# Patient Record
Sex: Male | Born: 2000 | Race: White | Hispanic: No | Marital: Single | Smoking: Never smoker
Health system: Southern US, Community
[De-identification: ages and names within clinical notes are randomized; demographics above are authoritative.]

## PROBLEM LIST (undated history)

## (undated) DIAGNOSIS — Z8701 Personal history of pneumonia (recurrent): Secondary | ICD-10-CM

## (undated) DIAGNOSIS — L309 Dermatitis, unspecified: Secondary | ICD-10-CM

## (undated) DIAGNOSIS — E669 Obesity, unspecified: Secondary | ICD-10-CM

## (undated) HISTORY — DX: Personal history of pneumonia (recurrent): Z87.01

## (undated) HISTORY — DX: Dermatitis, unspecified: L30.9

## (undated) HISTORY — DX: Obesity, unspecified: E66.9

---
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
## 2018-02-04 ENCOUNTER — Other Ambulatory Visit (INDEPENDENT_AMBULATORY_CARE_PROVIDER_SITE_OTHER): Payer: 59

## 2018-02-04 DIAGNOSIS — E038 Other specified hypothyroidism: Secondary | ICD-10-CM

## 2018-02-04 DIAGNOSIS — E039 Hypothyroidism, unspecified: Secondary | ICD-10-CM | POA: Diagnosis not present

## 2018-02-04 DIAGNOSIS — F418 Other specified anxiety disorders: Secondary | ICD-10-CM

## 2018-02-05 LAB — T4, FREE: FREE T4: 1.22 ng/dL (ref 0.93–1.60)

## 2018-02-05 LAB — TSH: TSH: 2.65 u[IU]/mL (ref 0.450–4.500)

## 2018-02-05 LAB — T3, FREE: T3, Free: 4 pg/mL (ref 2.3–5.0)

## 2018-02-25 ENCOUNTER — Encounter: Payer: Self-pay | Admitting: Family Medicine

## 2018-02-25 ENCOUNTER — Ambulatory Visit (INDEPENDENT_AMBULATORY_CARE_PROVIDER_SITE_OTHER): Payer: 59 | Admitting: Family Medicine

## 2018-02-25 VITALS — BP 129/79 | HR 60 | Ht 72.0 in | Wt 361.2 lb

## 2018-02-25 DIAGNOSIS — F418 Other specified anxiety disorders: Secondary | ICD-10-CM

## 2018-02-25 DIAGNOSIS — R6889 Other general symptoms and signs: Secondary | ICD-10-CM | POA: Insufficient documentation

## 2018-02-25 DIAGNOSIS — E038 Other specified hypothyroidism: Secondary | ICD-10-CM

## 2018-02-25 DIAGNOSIS — G4700 Insomnia, unspecified: Secondary | ICD-10-CM

## 2018-02-25 DIAGNOSIS — E039 Hypothyroidism, unspecified: Secondary | ICD-10-CM

## 2018-02-25 DIAGNOSIS — Z6841 Body Mass Index (BMI) 40.0 and over, adult: Secondary | ICD-10-CM

## 2018-02-25 NOTE — Progress Notes (Signed)
Impression and Recommendations:    1. Morbid obesity with BMI of 50.0-59.9, adult (HCC)   2. Subclinical hypothyroidism   3. Poor motivation vs poor focus   4. Situational anxiety   5. Insomnia, unspecified type     Weight loss -Pt has not been meeting exercise goals -Pt has lost 7lbs since last appointment due to dietary changes -Discussed the importance of small, attainable goals for maintaining lifestyle changes -Discussed low calorie options for beverages like green stevia packets or monk fruit to sweeten drinks  -Discussed how important it is to put his health first and focus on making good changes such as healthy diet -Encouraged pt to reset his goal of increasing his exercise levels including walking at least 5 min each day -Follow up in 2 months to evaluate weight loss  Focus/motivation -Discussed the roll of PCP in ADHD care -Encouraged pt to make an appointment with [facility] for testing  -Discussed common symptoms and symptoms that are not related to ADHD such as auditory hallucinations -Strongly encouraged pt contact us if auditory hallucinations increase -Discussed the difference between psychology and psychiatry -Instructed to created follow up appointment after the appointment with [facility]  Insomnia -Encouraged pt to consider non-medicinal ways to help with sleep -Discussed sleep meditation to help calm down before bed -Recommended pt to look up youtube videos to help with winding down mind before bed  Hypothyroidism -Encouraged pt to take thyroid medication as directed -Encouraged pt to set a daily alarm to remind himself to take prescriptions -Discussed current thyroid labs and indications  Gross side effects, risk and benefits, and alternatives of medications and treatment plan in general discussed with patient.  Patient is aware that all medications have potential side effects and we are unable to predict  every side effect or drug-drug interaction that may occur.   Patient will call with any questions prior to using medication if they have concerns.  Expresses verbal understanding and consents to current therapy and treatment regimen.  No barriers to understanding were identified.  Red flag symptoms and signs discussed in detail.  Patient expressed understanding regarding what to do in case of emergency\urgent symptoms  Please see AVS handed out to patient at the end of our visit for further patient instructions/ counseling done pertaining to today's office visit.   Return for 2-3 mo after seen by [facility].    Note:  This note was prepared with assistance of Dragon voice recognition software. Occasional wrong-word or sound-a-like substitutions may have occurred due to the inherent limitations of voice recognition software.  This document serves as a record of services personally performed by Thomasene Lot, MD. It was created on her behalf by Alphonse Guild, a trained medical scribe. The creation of this record is based on the scribe's personal observations and the provider's statements to them.   I have reviewed the above medical documentation for accuracy and completeness and I concur.  Thomasene Lot 02/26/18 1:44 PM   --------------------------------------------------------------------------------------------------------------------------------------------------------------------------------------------------------------------------------------------    Subjective:     HPI: Gregory Reed is a 17 y.o. male who presents to [facility] today for issues as discussed below.  Lifestyle -Pt is in a long distance relationship with a man -Pt has some concerns about ADHD and is interested in testing -Feels unsure about whether his issues are due to anxiety or ADHD  Weight Loss -Pt has lost 7 lbs since last appointment -States he  hasn't been  checking his weight at home because he forgets -Pt believes he has been eating better and that's helped him lose weight -Pt has reduced some of his usual carb intake such as bread -Has started increasing his amount of vegetables such as salad -He has still been drinking tea, some sodas and has not increased his water intake  Thyroid -Pt states he forgets to take the thyroid medication "once or twice a week" -Pt TSH levels are at 2.65, down from 5.1 on December 10, 2017  Focus/motivation -Pt is concerned that he may have ADHD -States he has noticed an issue with focus, paying attention, and following conversations -Believes he has been "hearing things" which he believes are related, but states he may have just been paranoid because he was in a new environment that day -Pt states he's never had issues in the past with focus or had any documentation of issues  Insomnia -PT states he has hasn't been having any more issues with insomnia "than usual"      Wt Readings from Last 3 Encounters:  02/25/18 (!) 361 lb 3.2 oz (163.8 kg) (>99 %, Z= 3.58)*  12/26/17 (!) 368 lb (166.9 kg) (>99 %, Z= 3.62)*  10/15/17 (!) 372 lb (168.7 kg) (>99 %, Z= 3.63)*   * Growth percentiles are based on CDC (Boys, 2-20 Years) data.   BP Readings from Last 3 Encounters:  02/25/18 129/79  12/26/17 124/78  10/15/17 121/82   Pulse Readings from Last 3 Encounters:  02/25/18 60  12/26/17 85  10/15/17 73   BMI Readings from Last 3 Encounters:  02/25/18 48.99 kg/m (>99 %, Z= 3.12)*  12/26/17 49.91 kg/m (>99 %, Z= 3.16)*  10/15/17 50.45 kg/m (>99 %, Z= 3.19)*   * Growth percentiles are based on CDC (Boys, 2-20 Years) data.     Patient Care Team    Relationship Specialty Notifications Start End  Thomasene Lot, DO PCP - General Family Medicine  10/15/17   Eustaquio Boyden, MD Consulting Physician Family Medicine  10/15/17      Patient Active Problem List   Diagnosis Date Noted  . Subclinical  hypothyroidism 12/26/2017    Priority: High  . Elevated cholesterol with elevated triglycerides 12/26/2017    Priority: High  . Morbid obesity with BMI of 50.0-59.9, adult [facility])     Priority: High  . Low HDL (under 40) 12/26/2017    Priority: Medium  . Anxious mood 10/15/2017    Priority: Medium  . Insomnia 10/15/2017    Priority: Medium  . Situational anxiety 08/24/2017    Priority: Medium  . Headache 01/29/2016    Priority: Medium  . Poor motivation vs poor focus 02/25/2018  . Left ankle injury 02/16/2017  . Seasonal allergic rhinitis 05/11/2014  . Well adolescent visit 05/11/2014    Past Medical history, Surgical history, Family history, Social history, Allergies and Medications have been entered into the medical record, reviewed and changed as needed.    Current Meds  Medication Sig  . fluticasone (FLONASE) 50 MCG/ACT nasal spray Place 2 sprays into both nostrils daily.  . hydrOXYzine (ATARAX/VISTARIL) 25 MG tablet Take 0.5-1 tablets (12.5-25 mg total) by mouth 3 (three) times daily as needed for anxiety.  Marland Kitchen levothyroxine (SYNTHROID, LEVOTHROID) 25 MCG tablet Take 1 tablet (25 mcg total) by mouth daily.    Allergies:  Allergies  Allergen Reactions  . Codeine Nausea And Vomiting     Review of Systems:  A fourteen system review of systems was performed  and found to be positive as per HPI.   Objective:   Blood pressure 129/79, pulse 60, height 6' (1.829 m), weight (!) 361 lb 3.2 oz (163.8 kg), SpO2 98 %. Body mass index is 48.99 kg/m. General:  Well Developed, well nourished, appropriate for stated age.  Neuro:  Alert and oriented,  extra-ocular muscles intact  HEENT:  Normocephalic, atraumatic, neck supple, no carotid bruits appreciated  Skin:  no gross rash, warm, pink. Cardiac:  RRR, S1 S2 Respiratory:  ECTA B/L and A/P, Not using accessory muscles, speaking in full sentences- unlabored. Vascular:  Ext warm, no cyanosis apprec.; cap RF less 2 sec. Psych:  No  HI/SI, judgement and insight good, Euthymic mood. Full Affect.

## 2018-02-25 NOTE — Patient Instructions (Addendum)
-  Goal of walking 5 min every single day. -Goal of drinking no beverages with calories. Pt can drink diet drinks such as coke zero, diet green tea, flavored sparkling water, or sparkling ice with caffeine for energy -Use green stevia packets or monk fruit to sweeten drinks to avoid calories  [facility] has physicians that are able to diagnose ADHD

## 2018-03-01 ENCOUNTER — Encounter: Payer: 59 | Admitting: Family Medicine

## 2018-08-19 ENCOUNTER — Other Ambulatory Visit: Payer: Self-pay | Admitting: Family Medicine

## 2018-09-10 ENCOUNTER — Other Ambulatory Visit: Payer: Self-pay

## 2018-09-10 ENCOUNTER — Ambulatory Visit (INDEPENDENT_AMBULATORY_CARE_PROVIDER_SITE_OTHER): Payer: 59 | Admitting: Family Medicine

## 2018-09-10 ENCOUNTER — Encounter: Payer: Self-pay | Admitting: Family Medicine

## 2018-09-10 DIAGNOSIS — E782 Mixed hyperlipidemia: Secondary | ICD-10-CM

## 2018-09-10 DIAGNOSIS — E786 Lipoprotein deficiency: Secondary | ICD-10-CM | POA: Diagnosis not present

## 2018-09-10 DIAGNOSIS — F418 Other specified anxiety disorders: Secondary | ICD-10-CM | POA: Diagnosis not present

## 2018-09-10 DIAGNOSIS — R6889 Other general symptoms and signs: Secondary | ICD-10-CM

## 2018-09-10 DIAGNOSIS — Z6841 Body Mass Index (BMI) 40.0 and over, adult: Secondary | ICD-10-CM

## 2018-09-10 NOTE — Progress Notes (Signed)
Virtual Visit via Telephone Note for [employer], D.O- Primary Care Physician at [facility]   I connected with current patient today by telephone and verified that I am speaking with the correct person using two identifiers.   I discussed the limitations, risks, security and privacy concerns of performing an evaluation and management service by telephone and the limited availability of in person appointments during this current national crisis of the Covid-19 pandemic.  My staff members also discussed with the patient that there may be a patient responsible charge related to this service.  The patient expressed understanding and agreed to proceed.     History of Present Illness:  - did not go to [facility] for assessment of ADD  - has not weighed himself  - has not achieved any of his health goals he set forth last OV    GOALS for last visit- 9/19:  -Goal of walking 5 min every single day. -Goal of drinking no beverages with calories. Pt can drink diet drinks such as coke zero, diet green tea, flavored sparkling water, or sparkling ice with caffeine for energy -Use green stevia packets or monk fruit to sweeten drinks to avoid Calories for your   - [facility] has specialty physicians that are able to fully assess and diagnose ADHD through various tools that I do not have access to. Please call them for an office visit for eval    Wt Readings from Last 3 Encounters:  02/25/18 (!) 361 lb 3.2 oz (163.8 kg) (>99 %, Z= 3.58)*  12/26/17 (!) 368 lb (166.9 kg) (>99 %, Z= 3.62)*  10/15/17 (!) 372 lb (168.7 kg) (>99 %, Z= 3.63)*   * Growth percentiles are based on CDC (Boys, 2-20 Years) data.   BP Readings from Last 3 Encounters:  02/25/18 129/79  12/26/17 124/78  10/15/17 121/82   Pulse Readings from Last 3 Encounters:  02/25/18 60  12/26/17 85  10/15/17 73   BMI Readings from Last 3 Encounters:  02/25/18 48.99 kg/m (>99 %, Z= 3.12)*  12/26/17  49.91 kg/m (>99 %, Z= 3.16)*  10/15/17 50.45 kg/m (>99 %, Z= 3.19)*   * Growth percentiles are based on CDC (Boys, 2-20 Years) data.     Patient Care Team    Relationship Specialty Notifications Start End  Thomasene Lot, DO PCP - General Family Medicine  10/15/17   Eustaquio Boyden, MD Consulting Physician Family Medicine  10/15/17      Patient Active Problem List   Diagnosis Date Noted  . Subclinical hypothyroidism 12/26/2017    Priority: High  . Elevated cholesterol with elevated triglycerides 12/26/2017    Priority: High  . Morbid obesity with BMI of 50.0-59.9, adult [facility])     Priority: High  . Low HDL (under 40) 12/26/2017    Priority: Medium  . Anxious mood 10/15/2017    Priority: Medium  . Insomnia 10/15/2017    Priority: Medium  . Situational anxiety 08/24/2017    Priority: Medium  . Headache 01/29/2016    Priority: Medium  . Poor motivation vs poor focus 02/25/2018  . Left ankle injury 02/16/2017  . Seasonal allergic rhinitis 05/11/2014  . Well adolescent visit 05/11/2014     No outpatient medications have been marked as taking for the 09/10/18 encounter (Office Visit) with Thomasene Lot, DO.     Allergies:  Allergies  Allergen Reactions  . Codeine Nausea And Vomiting     ROS:  See above HPI for pertinent positives  and negatives   Objective:   There were no vitals taken for this visit. There is no height or weight on file to calculate BMI.   General: sounds in no acute distress.  Skin: Pt confirms warm and dry  extremities and pink fingertips Respiratory: speaking in full sentences, no conversational dyspnea Psych: A and O *3, appears insight good, mood- full      Impression and Recommendations:     - pt asked for me to email him his goals from last OV and also email him at [email or web address]  1. Poor motivation vs poor focus- ? is add or not- never been evaluated by specialists - Again told pt to f/up for full testing and eval thru  specialist - told pt he is not hitting goals and even the small ones we set are important for him to acheive  2. Morbid obesity with BMI of 50.0-59.9, adult (HCC)  - Explained to patient what BMI refers to, and what it means medically.    Told patient to think about it as a "medical risk stratification measurement" and how increasing BMI is associated with increasing risk/ or worsening state of various diseases such as hypertension, hyperlipidemia, diabetes, premature OA, depression/ mood d/o;  Dec energy levels etc.etc.  - R/w pt again American Heart Association guidelines for healthy diet, basically Mediterranean diet, and exercise guidelines of a min of 30 minutes 5 days per week or more in detail. - Health counseling performed.  All questions answered.   3. Situational anxiety Well controlled at current b/c pt is home bound due to covid   4. Extensive Health counseling and education - Novel Covid -19 counseling done; all questions were answered.  Reminded pt of extreme importance of social distancing; minimizing contacts with others etc etc  -  Sent pt separate message via MyChart ( per his request ) stating: "Apolinar Junes,   It was great to speak with you today.   I do not know what your weight is now but as we discussed, please keep an eye on it at home.  Remember, you were doing great and had lost 9 pounds between May 2019 and September 2019 when I saw you.  Let's keep it up!! :)   GOALS:   -Goal of walking at least 5 min every single day.  - Goal would be to keep a more regular sleep-wake cycle and be consistent with it.  Falling asleep by 11 PM and waking up at 7 for instance is a healthy habit.   This is 8 hours of sleep and ideal.    If you sleep too much or too little, this can cause detrimental health effects.  It can actually cause you to gain weight, cause mood changes etc  -Goal of drinking no beverages with calories. Pt can drink diet drinks such as coke zero, diet green tea,  flavored sparkling water, or sparkling ice with caffeine for energy  -Use green stevia packets or monk fruit to sweeten drinks to avoid Calories for your   - [facility] has specialty physicians that are able to fully assess and diagnose ADHD through various tools that I do not have access to. Please call them for an office visit for eval  - I look forward to chatting with you about these goals and your progress in 3 months.  Be well,   Dr Sharee Holster"   I discussed the assessment and treatment plan with the patient. The patient was provided an opportunity to ask  questions and all were answered. The patient agreed with the plan and demonstrated an understanding of the instructions.   The patient was advised to call back or seek an in-person evaluation if the symptoms worsen or if the condition fails to improve as anticipated.   No orders of the defined types were placed in this encounter.   No orders of the defined types were placed in this encounter.   There are no discontinued medications.   Gross side effects, risk and benefits, and alternatives of medications and treatment plan in general discussed with patient.  Patient is aware that all medications have potential side effects and we are unable to predict every side effect or drug-drug interaction that may occur.   Patient was strongly encouraged to call with any questions or concerns they may have concerns.    Expresses verbal understanding and consents to current therapy and treatment regimen.  No barriers to understanding were identified.  Red flag symptoms and signs discussed in detail.  Patient expressed understanding regarding what to do in case of emergency\urgent symptoms  Please see AVS handed out to patient at the end of our visit for further patient instructions/ counseling done pertaining to today's office visit.  I provided 18+ minutes of non-face-to-face time during this encounter.     Thomasene Lot, DO

## 2018-09-18 ENCOUNTER — Telehealth: Payer: Self-pay | Admitting: Family Medicine

## 2018-09-18 NOTE — Telephone Encounter (Signed)
FYI to medical assistant , patient left message to call office to set up provider required OV--glh

## 2018-09-18 NOTE — Telephone Encounter (Signed)
-----   Message from Thomasene Lot, DO sent at 09/10/2018  3:32 PM EDT ----- Regarding: see below Please call patient and let him know that we can only send personal information securely through my chart and not to personal emails due to HIPAA regulations and rules.     -So please let him know he will not be getting an email from me.  Have him please check my chart and he can even print this off for his review if he would like.  Thank you Dondra Spry for calling and notifying patient.  Dr Val Eagle

## 2019-01-28 ENCOUNTER — Other Ambulatory Visit: Payer: Self-pay | Admitting: Family Medicine

## 2019-01-28 ENCOUNTER — Telehealth: Payer: Self-pay

## 2019-01-28 NOTE — Telephone Encounter (Signed)
Sent message to the front staff to call and schedule him for a follow up.

## 2019-01-28 NOTE — Telephone Encounter (Signed)
Okay thanks for letting me know.  Hopefully he will come in

## 2019-01-28 NOTE — Telephone Encounter (Signed)
Gregory Reed needs a follow up appointment. He is past due and is in need of lab work.

## 2019-02-06 ENCOUNTER — Ambulatory Visit: Payer: 59 | Admitting: Family Medicine

## 2019-02-11 ENCOUNTER — Other Ambulatory Visit: Payer: Self-pay

## 2019-02-11 ENCOUNTER — Other Ambulatory Visit: Payer: 59

## 2019-02-11 DIAGNOSIS — E039 Hypothyroidism, unspecified: Secondary | ICD-10-CM

## 2019-02-11 DIAGNOSIS — E038 Other specified hypothyroidism: Secondary | ICD-10-CM

## 2019-02-11 DIAGNOSIS — E786 Lipoprotein deficiency: Secondary | ICD-10-CM

## 2019-02-11 DIAGNOSIS — Z0000 Encounter for general adult medical examination without abnormal findings: Secondary | ICD-10-CM

## 2019-02-11 DIAGNOSIS — E782 Mixed hyperlipidemia: Secondary | ICD-10-CM

## 2019-02-12 LAB — T3: T3, Total: 154 ng/dL (ref 71–180)

## 2019-02-12 LAB — COMPREHENSIVE METABOLIC PANEL WITH GFR
ALT: 28 IU/L (ref 0–44)
AST: 19 IU/L (ref 0–40)
Albumin/Globulin Ratio: 2 (ref 1.2–2.2)
Albumin: 4.2 g/dL (ref 4.1–5.2)
Alkaline Phosphatase: 107 IU/L (ref 39–117)
BUN/Creatinine Ratio: 13 (ref 9–20)
BUN: 13 mg/dL (ref 6–20)
Bilirubin Total: 0.2 mg/dL (ref 0.0–1.2)
CO2: 23 mmol/L (ref 20–29)
Calcium: 9.5 mg/dL (ref 8.7–10.2)
Chloride: 100 mmol/L (ref 96–106)
Creatinine, Ser: 1 mg/dL (ref 0.76–1.27)
GFR calc Af Amer: 125 mL/min/1.73
GFR calc non Af Amer: 108 mL/min/1.73
Globulin, Total: 2.1 g/dL (ref 1.5–4.5)
Glucose: 133 mg/dL — ABNORMAL HIGH (ref 65–99)
Potassium: 4.1 mmol/L (ref 3.5–5.2)
Sodium: 138 mmol/L (ref 134–144)
Total Protein: 6.3 g/dL (ref 6.0–8.5)

## 2019-02-12 LAB — CBC WITH DIFFERENTIAL/PLATELET
Basophils Absolute: 0 10*3/uL (ref 0.0–0.2)
Basos: 0 %
EOS (ABSOLUTE): 0.2 10*3/uL (ref 0.0–0.4)
Eos: 2 %
Hematocrit: 45 % (ref 37.5–51.0)
Hemoglobin: 14.6 g/dL (ref 13.0–17.7)
Immature Grans (Abs): 0.1 10*3/uL (ref 0.0–0.1)
Immature Granulocytes: 1 %
Lymphocytes Absolute: 3.7 10*3/uL — ABNORMAL HIGH (ref 0.7–3.1)
Lymphs: 39 %
MCH: 29.4 pg (ref 26.6–33.0)
MCHC: 32.4 g/dL (ref 31.5–35.7)
MCV: 91 fL (ref 79–97)
Monocytes Absolute: 0.5 10*3/uL (ref 0.1–0.9)
Monocytes: 5 %
Neutrophils Absolute: 5.1 10*3/uL (ref 1.4–7.0)
Neutrophils: 53 %
Platelets: 246 10*3/uL (ref 150–450)
RBC: 4.96 x10E6/uL (ref 4.14–5.80)
RDW: 12.8 % (ref 11.6–15.4)
WBC: 9.6 10*3/uL (ref 3.4–10.8)

## 2019-02-12 LAB — LIPID PANEL
Chol/HDL Ratio: 3.5 ratio (ref 0.0–5.0)
Cholesterol, Total: 158 mg/dL (ref 100–199)
HDL: 45 mg/dL
LDL Chol Calc (NIH): 89 mg/dL (ref 0–99)
Triglycerides: 136 mg/dL (ref 0–149)
VLDL Cholesterol Cal: 24 mg/dL (ref 5–40)

## 2019-02-12 LAB — HEMOGLOBIN A1C
Est. average glucose Bld gHb Est-mCnc: 103 mg/dL
Hgb A1c MFr Bld: 5.2 % (ref 4.8–5.6)

## 2019-02-12 LAB — TSH: TSH: 1.5 u[IU]/mL (ref 0.450–4.500)

## 2019-02-12 LAB — VITAMIN D 25 HYDROXY (VIT D DEFICIENCY, FRACTURES): Vit D, 25-Hydroxy: 22.7 ng/mL — ABNORMAL LOW (ref 30.0–100.0)

## 2019-02-12 LAB — T4, FREE: Free T4: 1.2 ng/dL (ref 0.82–1.77)

## 2019-02-13 ENCOUNTER — Other Ambulatory Visit: Payer: Self-pay

## 2019-02-13 ENCOUNTER — Encounter: Payer: Self-pay | Admitting: Family Medicine

## 2019-02-13 ENCOUNTER — Ambulatory Visit (INDEPENDENT_AMBULATORY_CARE_PROVIDER_SITE_OTHER): Payer: 59 | Admitting: Family Medicine

## 2019-02-13 VITALS — Temp 97.1°F | Ht 72.0 in | Wt 369.5 lb

## 2019-02-13 DIAGNOSIS — E559 Vitamin D deficiency, unspecified: Secondary | ICD-10-CM | POA: Diagnosis not present

## 2019-02-13 DIAGNOSIS — E039 Hypothyroidism, unspecified: Secondary | ICD-10-CM

## 2019-02-13 DIAGNOSIS — G4700 Insomnia, unspecified: Secondary | ICD-10-CM

## 2019-02-13 DIAGNOSIS — E786 Lipoprotein deficiency: Secondary | ICD-10-CM

## 2019-02-13 DIAGNOSIS — E038 Other specified hypothyroidism: Secondary | ICD-10-CM

## 2019-02-13 DIAGNOSIS — E782 Mixed hyperlipidemia: Secondary | ICD-10-CM | POA: Diagnosis not present

## 2019-02-13 DIAGNOSIS — R6889 Other general symptoms and signs: Secondary | ICD-10-CM

## 2019-02-13 DIAGNOSIS — Z6841 Body Mass Index (BMI) 40.0 and over, adult: Secondary | ICD-10-CM

## 2019-02-13 DIAGNOSIS — F418 Other specified anxiety disorders: Secondary | ICD-10-CM

## 2019-02-13 MED ORDER — LEVOTHYROXINE SODIUM 25 MCG PO TABS: 25.0000 ug | ORAL_TABLET | Freq: Every day | ORAL | 1 refills | Status: DC

## 2019-02-13 MED ORDER — VITAMIN D (ERGOCALCIFEROL) 1.25 MG (50000 UNIT) PO CAPS: ORAL_CAPSULE | ORAL | 3 refills | Status: DC

## 2019-02-13 NOTE — Progress Notes (Signed)
Telehealth office visit note for Gregory Reed, D.O- at Primary Care at [facility]   I connected with current patient today and verified that I am speaking with the correct person using two identifiers.    Location of the patient: Home  Location of the provider: Office Only the patient (+/- their family members at pt's discretion) and myself were participating in the encounter    - This visit type was conducted due to national recommendations for restrictions regarding the COVID-19 Pandemic (e.g. social distancing) in an effort to limit this patient's exposure and mitigate transmission in our community.  This format is felt to be most appropriate for this patient at this time.   - The patient did not have access to video technology or had technical difficulties with video requiring transitioning to audio format only. - No physical exam could be performed with this format, beyond that communicated to [place] by the patient/ family members as noted.   - Additionally my office staff/ schedulers discussed with the patient that there may be a monetary charge related to this service, depending on their medical insurance.   The patient expressed understanding, and agreed to proceed.       History of Present Illness:   States he's doing pretty good today.  Says he "hasn't checked his goals in a little while," so he can't remember what they were.  Dietary Habits Since last visit, notes he has been eating a lot better.  Say eating "less junk food in general, more fruits, some vegetables, eating a lot more meals with a lot of protein in them, basically."  States trying to eat more chicken.  "I don't really eat a lot of fish because I don't like it, same with [place], so it's mostly been chicken protein."  Sleeping Habits Feels he has not been sleeping well.  "Frequently I've just been ... I can't settle down at night."  Using benadryl since his allergies act up this time of year anyway, to  help him go to bed at night.  Says the past 2-3 days he's been using Benadryl to help him go to bed at 1 AM instead of "when the sun comes up."  Says he goes to bed at 1-2 now instead of 4 or later.  Notes most of his friends are night-owls and he stays up to socialize.  Anxiety Says his anxiety is a lot better.  Notes it mostly flares up in social situations when he's talking to people he doesn't know that well.  "Aside from that, it's mostly been okay.  Weight Says he has not been following his weight too much at home.  Says "last time I documented it, I was around 400 lbs."  Says it was 400-something at some point, but this morning he was 369.5 lbs.  States "my memory's not the greatest," so he doesn't remember how much he weighed in 2015.  However, in the chart, the patient weighed 242 lbs in 2015.  Notes he rarely goes for walks outside.  Focus Issues Notes isn't sure if this has improved.  Says "it depends on the thing that I'm doing."  Says "there are some things I can focus on so long that I lose track of time, and I check the clock and it's three hours later."  Then "there are other things that I lose interest and motivation to do almost immediately, and I don't want to do it."  Vitamin D Deficiency Per patient,  thinks he may already take Vitamin D OTC.  Subclinical Hypothyroidism Per patient states "I've made sure to take [my synthroid] every day unless I straight up forget to."  Notes he tries to take it daily, but sometimes he hasn't taken his synthroid in the morning because "I'm supposed to take it on an empty stomach, but when I wake up and someone offers me food in the morning, I eat and then realize I can't take it."  Notes "sleepy brain says food is good and so I eat that and then I can't take the synthroid sometimes."   GAD 7 : Generalized Anxiety Score 10/15/2017 08/24/2017  Nervous, Anxious, on Edge 2 3  Control/stop worrying 3 3  Worry too much - different things 2 3    Trouble relaxing 1 3  Restless 1 1  Easily annoyed or irritable 2 1  Afraid - awful might happen 2 3  Total GAD 7 Score 13 17    Depression screen [facility] 2/9 02/13/2019 02/25/2018 12/26/2017 10/15/2017 08/24/2017  Decreased Interest 1 1 2 1 1   Down, Depressed, Hopeless 0 2 1 0 1  PHQ - 2 Score 1 3 3 1 2   Altered sleeping 3 3 3 1 2   Tired, decreased energy 1 1 2 2 1   Change in appetite 0 2 1 2 3   Feeling bad or failure about yourself  1 3 2 2 1   Trouble concentrating 0 3 2 1 1   Moving slowly or fidgety/restless 0 1 1 2 1   Suicidal thoughts 0 0 0 0 0  PHQ-9 Score 6 16 14 11 11   Difficult doing work/chores Somewhat difficult Somewhat difficult Somewhat difficult Not difficult at all -        Impression and Recommendations:    1. Subclinical hypothyroidism   2. Vitamin D deficiency   3. Morbid obesity with BMI of 50.0-59.9, adult (HCC)   4. Elevated cholesterol with elevated triglycerides   5. Low HDL (under 40)   6. Insomnia, unspecified type   7. Poor motivation vs poor focus   8. Situational anxiety      - Patient has not reached any of his goals and is not weighing himself.  - Reviewed recent lab work (02/12/2019) in depth with patient today.  All lab work within normal limits unless otherwise noted.  Extensive education provided and all questions answered.  Subclinical Hypothyroidism - WNL on Synthroid - Thyroid function stable on current management. - Advised patient to continue taking synthroid as he has been taking them.  See med list.  - Last 90-day refill was 08/19/2018. - States "I remember it said in the prescription to take it daily so I have been, except for the times I wake up and eat food and cannot take it on an empty stomach."  - Will continue to monitor closely.  Elevated Cholesterol w/ Elevated Triglycerides, Low HDL (now resolved) Triglycerides went down to 136 from 141 a year ago. HDL increased to 45 from 35 a year ago.  - Encouraged patient to continue  with healthier eating habits, including food low in saturated & trans fat and low in carbs.  Encouraged regular exercise and weight loss when appropriate.   - Direct LDL ordered today.  See orders. - Will continue to monitor and re-check as recommended.  Vitamin D Deficiency - Vitamin D 22.7, low. - Begin supplementation regularly as prescribed. - Advised patient to take 5000 IU's OTC daily. - In addition, begin Rx Vitamin D once-weekly.  See med list.  - To help increase his Vitamin D values, encouraged patient to begin engaging in regular exercise outside. - Will continue to monitor.  Insomnia - Per pt, not optimal, but stable at this time. - Prudent sleep hygiene extensively discussed. - Encouraged patient to restore his sleep-wake cycles as advised. - All questions were answered. - Will continue to monitor.  Situational Anxiety - Stable at this time.  - In addition to possible prescription intervention, reviewed the "spokes of the wheel" of mood and health management.  Stressed the importance of ongoing prudent habits, including regular exercise, appropriate sleep hygiene, speaking with a life coach/therapist, healthful dietary habits, and prayer/meditation to relax.  - Will continue to monitor.  Poor Motivation vs Poor Focus - Encouraged patient to work on his daily health habits and "spokes of the wheel" of mood and health management.  Will continue to monitor.  BMI Counseling - Morbid Obesity, Body mass index is 50.11 kg/m Explained to patient what BMI refers to, and what it means medically.    Told patient to think about it as a "medical risk stratification measurement" and how increasing BMI is associated with increasing risk/ or worsening state of various diseases such as hypertension, hyperlipidemia, diabetes, premature OA, depression etc.  American Heart Association guidelines for healthy diet, basically Mediterranean diet, and exercise guidelines of 30 minutes 5 days  per week or more discussed in detail.  - Encouraged patient to implement a tracking app such as LoseIt to help him monitor his nutritional intake.  - Per patient, would like to be involved in weight checks to help motivate himself.  Understands that he will not be able to make a significant change with his weight goals if he does not seek appropriate support.  Health Education & Preventative Health Maintenance - Advised patient to continue working toward exercising to improve overall mental, physical, and emotional health.  Encouraged patient to engage in daily physical activity, especially a formal exercise routine.   - Encouraged patient to continue with more prudent dietary habits, including low-carb, high vegetable, and high amounts of lean protein in diet.   - Patient should also consume adequate amounts of water.  - Health counseling performed.  All questions answered.   - As part of my medical decision making, I reviewed the following data within the electronic MEDICAL RECORD NUMBER History obtained from pt /family, CMA notes reviewed and incorporated if applicable, Labs reviewed, Radiograph/ tests reviewed if applicable and OV notes from prior OV's with me, as well as other specialists she/he has seen since seeing me last, were all reviewed and used in my medical decision making process today.   - Additionally, discussion had with patient regarding txmnt plan, and their biases/concerns about that plan were used in my medical decision making today.   - The patient agreed with the plan and demonstrated an understanding of the instructions.   No barriers to understanding were identified.   - Red flag symptoms and signs discussed in detail.  Patient expressed understanding regarding what to do in case of emergency\ urgent symptoms.  The patient was advised to call back or seek an in-person evaluation if the symptoms worsen or if the condition fails to improve as anticipated.   Return in about 3  months (around 05/15/2019).     Meds ordered this encounter  Medications   Vitamin D, Ergocalciferol, (DRISDOL) 1.25 MG (50000 UT) CAPS capsule    Sig: Take one tablet wkly    Dispense:  12 capsule    Refill:  3   levothyroxine (SYNTHROID) 25 MCG tablet    Sig: Take 1 tablet (25 mcg total) by mouth daily.    Dispense:  90 tablet    Refill:  1    Medications Discontinued During This Encounter  Medication Reason   levothyroxine (SYNTHROID, LEVOTHROID) 25 MCG tablet Reorder      I provided 23 minutes of non face-to-face time during this encounter.  Additional time was spent with charting and coordination of care after the actual visit commenced.   Note:  This note was prepared with assistance of Dragon voice recognition software. Occasional wrong-word or sound-a-like substitutions may have occurred due to the inherent limitations of voice recognition software.  Gregory Loteborah Peterson Mathey, DO  This document serves as a record of services personally performed by Gregory Loteborah Aaban Griep, DO. It was created on her behalf by Peggye FothergillKatherine Galloway, a trained medical scribe. The creation of this record is based on the scribe's personal observations and the provider's statements to them.   I have reviewed the above medical documentation for accuracy and completeness and I concur.  Gregory Loteborah Ashiya Kinkead, DO 02/13/2019 6:36 PM        Patient Care Team    Relationship Specialty Notifications Start End  Gregory Lotpalski, Aristeo Hankerson, DO PCP - General Family Medicine  10/15/17   Eustaquio BoydenGutierrez, Javier, MD Consulting Physician Family Medicine  10/15/17      -Vitals obtained; medications/ allergies reconciled;  personal medical, social, Sx etc.histories were updated by CMA, reviewed by me and are reflected in chart   Patient Active Problem List   Diagnosis Date Noted   Subclinical hypothyroidism 12/26/2017    Priority: High   Elevated cholesterol with elevated triglycerides 12/26/2017    Priority: High   Morbid obesity with  BMI of 50.0-59.9, adult (HCC)     Priority: High   Low HDL (under 40) 12/26/2017    Priority: Medium   Anxious mood 10/15/2017    Priority: Medium   Insomnia 10/15/2017    Priority: Medium   Situational anxiety 08/24/2017    Priority: Medium   Headache 01/29/2016    Priority: Medium   Vitamin D deficiency 02/13/2019   Poor motivation vs poor focus 02/25/2018   Left ankle injury 02/16/2017   Seasonal allergic rhinitis 05/11/2014   Well adolescent visit 05/11/2014     Current Meds  Medication Sig   fluticasone (FLONASE) 50 MCG/ACT nasal spray Place 2 sprays into both nostrils daily.   hydrOXYzine (ATARAX/VISTARIL) 25 MG tablet Take 0.5-1 tablets (12.5-25 mg total) by mouth 3 (three) times daily as needed for anxiety.   levothyroxine (SYNTHROID) 25 MCG tablet Take 1 tablet (25 mcg total) by mouth daily.   [DISCONTINUED] levothyroxine (SYNTHROID, LEVOTHROID) 25 MCG tablet TAKE 1 TABLET BY MOUTH EVERY DAY     Allergies:  Allergies  Allergen Reactions   Codeine Nausea And Vomiting     ROS:  See above HPI for pertinent positives and negatives   Objective:   Temperature (!) 97.1 F (36.2 C), temperature source Axillary, height 6' (1.829 m), weight (!) 369 lb 8 oz (167.6 kg).  (if some vitals are omitted, this means that patient was UNABLE to obtain them even though they were asked to get them prior to OV today.  They were asked to call us at their earliest convenience with these once obtained. )  General: A & O * 3; sounds in no acute distress; in usual state of health.  Skin: Pt confirms warm and  dry extremities and pink fingertips HEENT: Pt confirms lips non-cyanotic Chest: Patient confirms normal chest excursion and movement Respiratory: speaking in full sentences, no conversational dyspnea; patient confirms no use of accessory muscles Psych: insight appears good, mood- appears full

## 2019-02-15 ENCOUNTER — Encounter: Payer: Self-pay | Admitting: Family Medicine

## 2019-02-20 LAB — SPECIMEN STATUS REPORT

## 2019-02-20 LAB — LDL CHOLESTEROL, DIRECT: LDL Direct: 82 mg/dL (ref 0–99)

## 2019-05-02 ENCOUNTER — Other Ambulatory Visit: Payer: Self-pay

## 2019-05-02 DIAGNOSIS — Z20822 Contact with and (suspected) exposure to covid-19: Secondary | ICD-10-CM

## 2019-05-05 LAB — NOVEL CORONAVIRUS, NAA: SARS-CoV-2, NAA: NOT DETECTED

## 2019-05-07 ENCOUNTER — Encounter: Payer: Self-pay | Admitting: Family Medicine

## 2019-08-16 ENCOUNTER — Other Ambulatory Visit: Payer: Self-pay | Admitting: Family Medicine

## 2019-09-04 ENCOUNTER — Ambulatory Visit: Payer: 59 | Attending: Internal Medicine

## 2019-09-04 DIAGNOSIS — Z23 Encounter for immunization: Secondary | ICD-10-CM

## 2019-09-04 NOTE — Progress Notes (Signed)
   Covid-19 Vaccination Clinic  Name:  [place] Fant    MRN: [id number] DOB: 12/31/2000  09/04/2019  Mr. Coberly was observed post Covid-19 immunization for 15 minutes without incident. He was provided with Vaccine Information Sheet and instruction to access the V-Safe system.   Mr. Lave was instructed to call 911 with any severe reactions post vaccine: Marland Kitchen Difficulty breathing  . Swelling of face and throat  . A fast heartbeat  . A bad rash all over body  . Dizziness and weakness   Immunizations Administered    Name Date Dose VIS Date Route   Pfizer COVID-19 Vaccine 09/04/2019 12:52 PM 0.3 mL 05/23/2019 Intramuscular   Manufacturer: [employer], [employer]   Lot: [id number]   NDC: [address]-0     2

## 2019-09-11 ENCOUNTER — Other Ambulatory Visit: Payer: Self-pay | Admitting: Family Medicine

## 2019-09-11 ENCOUNTER — Telehealth: Payer: Self-pay

## 2019-09-11 NOTE — Telephone Encounter (Signed)
Please call pt to schedule appt.  No further refills until pt is seen.  T. Nehal Shives, CMA  

## 2019-09-23 ENCOUNTER — Other Ambulatory Visit: Payer: Self-pay | Admitting: Family Medicine

## 2019-09-29 ENCOUNTER — Ambulatory Visit: Payer: 59 | Attending: Internal Medicine

## 2019-09-29 DIAGNOSIS — Z23 Encounter for immunization: Secondary | ICD-10-CM

## 2019-09-29 NOTE — Progress Notes (Signed)
   Covid-19 Vaccination Clinic  Name:  Gregory Reed    MRN: [id number] DOB: 2000/11/12  09/29/2019  Mr. Gregory Reed was observed post Covid-19 immunization for 15 minutes without incident. He was provided with Vaccine Information Sheet and instruction to access the V-Safe system.   Gregory Reed was instructed to call 911 with any severe reactions post vaccine: Marland Kitchen Difficulty breathing  . Swelling of face and throat  . A fast heartbeat  . A bad rash all over body  . Dizziness and weakness   Immunizations Administered    Name Date Dose VIS Date Route   Pfizer COVID-19 Vaccine 09/29/2019 11:57 AM 0.3 mL 08/06/2018 Intramuscular   Manufacturer: [employer], [employer]   Lot: [id number]   NDC: [address]-8

## 2019-10-05 ENCOUNTER — Encounter: Payer: Self-pay | Admitting: Family Medicine

## 2019-10-06 MED ORDER — LEVOTHYROXINE SODIUM 25 MCG PO TABS: 25.0000 ug | ORAL_TABLET | Freq: Every day | ORAL | 0 refills | Status: DC

## 2019-10-13 ENCOUNTER — Other Ambulatory Visit: Payer: Self-pay | Admitting: Family Medicine

## 2019-10-14 NOTE — Telephone Encounter (Signed)
Patient last seen 02/13/19 and advised to follow up in 3 months. Patient labs last checked 02/11/19 and were within normal limits.   Patient has been given 30 day and 15 day supply of meds per refill protocol.   Please approve refill for what amount you deem appropriate. Patient has apt 10/16/19 with you. AS, CMA

## 2019-10-15 NOTE — Telephone Encounter (Signed)
Patient ws given 15 day supply on 10/06/19 and should not be out of medication yet. His appointment is tomorrow and will need to recheck TSH to ensure levothyroxine is at the appropriate dose. Pending his lab results will send additional refills or new rx with any dose adjustments.  Thank you, Kandis Cocking

## 2019-10-16 ENCOUNTER — Encounter: Payer: Self-pay | Admitting: Physician Assistant

## 2019-10-16 ENCOUNTER — Ambulatory Visit (INDEPENDENT_AMBULATORY_CARE_PROVIDER_SITE_OTHER): Payer: 59 | Admitting: Physician Assistant

## 2019-10-16 ENCOUNTER — Other Ambulatory Visit: Payer: Self-pay

## 2019-10-16 VITALS — BP 111/73 | HR 66 | Temp 97.6°F | Ht 72.0 in | Wt 365.3 lb

## 2019-10-16 DIAGNOSIS — E782 Mixed hyperlipidemia: Secondary | ICD-10-CM

## 2019-10-16 DIAGNOSIS — Z0000 Encounter for general adult medical examination without abnormal findings: Secondary | ICD-10-CM

## 2019-10-16 DIAGNOSIS — E039 Hypothyroidism, unspecified: Secondary | ICD-10-CM | POA: Diagnosis not present

## 2019-10-16 DIAGNOSIS — E559 Vitamin D deficiency, unspecified: Secondary | ICD-10-CM

## 2019-10-16 DIAGNOSIS — G4700 Insomnia, unspecified: Secondary | ICD-10-CM

## 2019-10-16 DIAGNOSIS — E038 Other specified hypothyroidism: Secondary | ICD-10-CM

## 2019-10-16 DIAGNOSIS — F418 Other specified anxiety disorders: Secondary | ICD-10-CM

## 2019-10-16 NOTE — Patient Instructions (Signed)
How to Increase Your Level of Physical Activity Getting regular physical activity is important for your overall health and well-being. Most people do not get enough exercise. There are easy ways to increase your level of physical activity, even if you have not been very active in the past or you are just starting out. Why is physical activity important? Physical activity has many short-term and long-term health benefits. Regular exercise can:  Help you lose weight or maintain a healthy weight.  Strengthen your muscles and bones.  Boost your mood and improve self-esteem.  Reduce your risk of certain long-term (chronic) diseases, like heart disease, cancer, and diabetes.  Help you stay capable of walking and moving around (mobile) as you age.  Prevent accidents, such as falls, as you age.  Increase life expectancy.  What are the benefits of being physically active on a regular basis? In addition to improving your physical health, being physically active on most days of the week can help you in ways that you may not expect. Benefits of regular physical activity may include:  Feeling good about your body.  Being able to move around more easily and for longer periods of time without getting tired (increased stamina).  Finding new sources of fun and enjoyment.  Meeting new people who share a common interest.  Being able to fight off illness better (enhanced immunity).  Being able to sleep better.  What can happen if I am not physically active on a regular basis? Not getting enough physical activity can lead to an unhealthy lifestyle and future health problems. This can increase your chances of:  Becoming overweight or obese.  Becoming sick.  Developing chronic illnesses, like heart disease or diabetes.  Having mental health problems, like depression or anxiety.  Having sleep problems.  Having trouble walking or getting yourself around (reduced mobility).  Injuring yourself  in a fall as you get older.  What steps can I take to be more physically active?  Check with your health care provider about how to get started. Ask your health care provider what activities are safe for you.  Start out slowly. Walking or doing some simple chair exercises is a good place to start, especially if you have not been active before or for a long time.  Try to find activities that you enjoy. You are more likely to commit to an exercise routine if it does not feel like a chore.  If you have bone or joint problems, choose low-impact exercises, like walking or swimming.  Include physical activity in your everyday routine.  Invite friends or family members to exercise with you. This also will help you commit to your workout plan.  Set goals that you can work toward.  Aim for at least 150 minutes of moderate-intensity exercise each week. Examples of moderate-intensity exercise include walking or riding a bike. Where to find more information:  Centers for Disease Control and Prevention: www.cdc.gov/physicalactivity/index.html  President's Council on Fitness, Sports & Nutrition www.fitness.gov/resource-center  ChooseMyPlate: www.choosemyplate.gov/physical-activity Contact a health care provider if:  You have headaches, muscle aches, or joint pain.  You feel dizzy or light-headed while exercising.  You faint.  You have chest pain while exercising. Summary  Exercise benefits your mind and body at any age, even if you are just starting out.  If you have a chronic illness or have not been active for a while, check with your health care provider before increasing your physical activity.  Choose activities that are safe and enjoyable   for you.Ask your health care provider what activities are safe for you.  Start slowly. Tell your health care provider if you have problems as you start to increase your activity level. This information is not intended to replace advice given to  you by your health care provider. Make sure you discuss any questions you have with your health care provider. Document Released: 05/18/2016 Document Revised: 05/18/2016 Document Reviewed: 05/18/2016 Elsevier Interactive Patient Education  2018 Elsevier Inc.  

## 2019-10-16 NOTE — Progress Notes (Signed)
 Established Patient Office Visit  Subjective:  Patient ID: Gregory Reed, male    DOB: 07/20/2000  Age: 18 y.o. MRN: [id number]  CC:  Chief Complaint  Patient presents with  . Hypothyroidism  . Anxiety  . Insomnia    HPI Gregory Reed presents for follow-up on hypothyroid, elevated cholesterol, insomnia, and mood management.  Hypothyroid: Pt reports medication compliance and denies side effects. Reports cold intolerance but denies bowel changes or weight changes.   Elevated cholesterol w/ elevated triglycerides: Pt states he is planning to be more active now that is warmer outside. He plans to start exercising- walking 5 minutes/day several times during the week and gradually increase his activity level. States him and his family have started a goal of eating healthier and have started by reducing fast food consumption.   Insomnia: Pt has been able to mostly re-establish his sleep schedule and doesn't have much insomnia anymore. States sleep is better.   Mood: Pt reports feeling well and has rarely needed to use Hydroxyzine for anxiety. States certain situations upset him but he has learned how to manage them.     Past Medical History:  Diagnosis Date  . Eczema   . History of pneumonia 2005, 2006  . Obesity     Past Surgical History:  Procedure Laterality Date  . TONSILLECTOMY  2005    Family History  Problem Relation Age of Onset  . Aneurysm Paternal Grandfather        thoracic aortic  . CVA Paternal Grandfather   . CVA Paternal Uncle   . Cancer Other        breast - maternal great grandmother  . Arthritis Father   . Crohn's disease Other        maternal side  . Diabetes Neg Hx   . CAD Neg Hx     Social History   Socioeconomic History  . Marital status: Single    Spouse name: Not on file  . Number of children: Not on file  . Years of education: Not on file  . Highest education level: Not on file  Occupational History  . Not on file  Tobacco Use  .  Smoking status: Never Smoker  . Smokeless tobacco: Never Used  Substance and Sexual Activity  . Alcohol use: No    Alcohol/week: 0.0 standard drinks  . Drug use: No  . Sexual activity: Never  Other Topics Concern  . Not on file  Social History Narrative   Home schooled   Lives with mom and dad at home, grandparents live up the driveway   6 dogs, 11 cats, chickens   Activity - no regular exercise.   Diet: no water, large sodas and juices, good fruits/vegetables   Social Determinants of Health   Financial Resource Strain:   . Difficulty of Paying Living Expenses:   Food Insecurity:   . Worried About Running Out of Food in the Last Year:   . Ran Out of Food in the Last Year:   Transportation Needs:   . Lack of Transportation (Medical):   . Lack of Transportation (Non-Medical):   Physical Activity:   . Days of Exercise per Week:   . Minutes of Exercise per Session:   Stress:   . Feeling of Stress :   Social Connections:   . Frequency of Communication with Friends and Family:   . Frequency of Social Gatherings with Friends and Family:   . Attends Religious Services:   . Active Member of   Clubs or Organizations:   . Attends Archivist Meetings:   Marland Kitchen Marital Status:   Intimate Partner Violence:   . Fear of Current or Ex-Partner:   . Emotionally Abused:   Marland Kitchen Physically Abused:   . Sexually Abused:     Outpatient Medications Prior to Visit  Medication Sig Dispense Refill  . fluticasone (FLONASE) 50 MCG/ACT nasal spray Place 2 sprays into both nostrils daily. 16 g 11  . levothyroxine (SYNTHROID) 25 MCG tablet Take 1 tablet (25 mcg total) by mouth daily. 15 tablet 0  . Vitamin D, Ergocalciferol, (DRISDOL) 1.25 MG (50000 UT) CAPS capsule Take one tablet wkly 12 capsule 3  . hydrOXYzine (ATARAX/VISTARIL) 25 MG tablet Take 0.5-1 tablets (12.5-25 mg total) by mouth 3 (three) times daily as needed for anxiety. 30 tablet 0   No facility-administered medications prior to  visit.    Allergies  Allergen Reactions  . Codeine Nausea And Vomiting    ROS Review of Systems  A fourteen system review of systems was performed and found to be positive as per HPI.   Objective:    Physical Exam  General: In no apparent distress, appears stated age. Eyes: PERRLA, EOMs, conjunctiva clr Resp: Respiratory effort- normal, ECTA B/L w/o W/R/R  Cardio: RRR  Abdomen: no gross distention. Lymphatics:  less 2 sec cap RF M-sk: Full ROM, normal gait.  Skin: Warm, dry  Neuro: Alert, Oriented Psych: Normal affect, Insight and Judgment appropriate.   BP 111/73   Pulse 66   Temp 97.6 F (36.4 C) (Oral)   Ht 6' (1.829 m)   Wt (!) 365 lb 4.8 oz (165.7 kg)   SpO2 97%   BMI 49.54 kg/m  Wt Readings from Last 3 Encounters:  10/16/19 (!) 365 lb 4.8 oz (165.7 kg)  02/13/19 (!) 369 lb 8 oz (167.6 kg)  02/25/18 (!) 361 lb 3.2 oz (163.8 kg) (>99 %, Z= 3.58)*   * Growth percentiles are based on CDC (Boys, 2-20 Years) data.     Health Maintenance Due  Topic Date Due  . HIV Screening  Never done    There are no preventive care reminders to display for this patient.  Lab Results  Component Value Date   TSH 1.500 02/11/2019   Lab Results  Component Value Date   WBC 9.6 02/11/2019   HGB 14.6 02/11/2019   HCT 45.0 02/11/2019   MCV 91 02/11/2019   PLT 246 02/11/2019   Lab Results  Component Value Date   NA 138 02/11/2019   K 4.1 02/11/2019   CO2 23 02/11/2019   GLUCOSE 133 (H) 02/11/2019   BUN 13 02/11/2019   CREATININE 1.00 02/11/2019   BILITOT 0.2 02/11/2019   ALKPHOS 107 02/11/2019   AST 19 02/11/2019   ALT 28 02/11/2019   PROT 6.3 02/11/2019   ALBUMIN 4.2 02/11/2019   CALCIUM 9.5 02/11/2019   GFR 122.81 02/01/2016   Lab Results  Component Value Date   CHOL 158 02/11/2019   Lab Results  Component Value Date   HDL 45 02/11/2019   Lab Results  Component Value Date   LDLCALC 89 02/11/2019   Lab Results  Component Value Date   TRIG 136  02/11/2019   Lab Results  Component Value Date   CHOLHDL 3.5 02/11/2019   Lab Results  Component Value Date   HGBA1C 5.2 02/11/2019      Assessment & Plan:   Problem List Items Addressed This Visit      Endocrine  Subclinical hypothyroidism - Primary   Relevant Orders   TSH   T4, free     Other   Situational anxiety   Insomnia   Elevated cholesterol with elevated triglycerides   Relevant Orders   Lipid Profile   Vitamin D deficiency   Relevant Orders   Vitamin D (25 hydroxy)    Other Visit Diagnoses    Healthcare maintenance       Relevant Orders   Comp Met (CMET)   Morbid obesity (HCC)       Relevant Orders   Comp Met (CMET)     Subclinical Hypothyroid: - Last thyroid panel (8 mons ago) wnl's - Pt symptomatic and will check TSH and free T4 today. - Pending lab results will send additional refills or new rx with dose adjustment if needed.  Eevated Cholesterol w/ Elevated Triglycerides, : - Last direct LDL (8 mo) wnl's - Encouraged patient to continue dietary and lifestyle changes and make small dietary changes at a time.  - Encouraged to increase physical activity as he has planned. - Pt is fasting and will recheck lipid panel today.  Situational anxiety: - stable, GAD-7 score of 4 - Continue Hydroxyzine 25 mg as needed for severe anxiety  Insomnia: - Has significantly improved and pt has restored sleep-wake cycle. - Will continue to monitor.  Obesity: - Pt has lost 5 pounds since OV in 02/2019. - Encouraged to continue dietary changes and increase physical activity to help with weight loss.   Vitamin D: - Last vitamin D low at 22.7 - Pt reports taking vitamin D 50000 units. - Will recheck vitamin D today and refill pending lab results.  No orders of the defined types were placed in this encounter.   Follow-up: Return in about 3 months (around 01/16/2020) for Hypothyroid, Elevated Cholesterol, Mood, Insomnia.    Maritza Abonza, PA-C 

## 2019-10-17 LAB — TSH: TSH: 1.71 u[IU]/mL (ref 0.450–4.500)

## 2019-10-17 LAB — T4, FREE: Free T4: 1.08 ng/dL (ref 0.82–1.77)

## 2019-10-17 LAB — COMPREHENSIVE METABOLIC PANEL
ALT: 28 IU/L (ref 0–44)
AST: 22 IU/L (ref 0–40)
Albumin/Globulin Ratio: 1.6 (ref 1.2–2.2)
Albumin: 4.1 g/dL (ref 4.1–5.2)
Alkaline Phosphatase: 107 IU/L (ref 39–117)
BUN/Creatinine Ratio: 15 (ref 9–20)
BUN: 13 mg/dL (ref 6–20)
Bilirubin Total: 0.4 mg/dL (ref 0.0–1.2)
CO2: 24 mmol/L (ref 20–29)
Calcium: 9.1 mg/dL (ref 8.7–10.2)
Chloride: 106 mmol/L (ref 96–106)
Creatinine, Ser: 0.87 mg/dL (ref 0.76–1.27)
GFR calc Af Amer: 144 mL/min/{1.73_m2} (ref >59)
GFR calc non Af Amer: 124 mL/min/{1.73_m2} (ref >59)
Globulin, Total: 2.5 g/dL (ref 1.5–4.5)
Glucose: 87 mg/dL (ref 65–99)
Potassium: 4.3 mmol/L (ref 3.5–5.2)
Sodium: 142 mmol/L (ref 134–144)
Total Protein: 6.6 g/dL (ref 6.0–8.5)

## 2019-10-17 LAB — LIPID PANEL
Chol/HDL Ratio: 3.4 ratio (ref 0.0–5.0)
Cholesterol, Total: 156 mg/dL (ref 100–199)
HDL: 46 mg/dL (ref >39)
LDL Chol Calc (NIH): 90 mg/dL (ref 0–99)
Triglycerides: 113 mg/dL (ref 0–149)
VLDL Cholesterol Cal: 20 mg/dL (ref 5–40)

## 2019-10-17 LAB — VITAMIN D 25 HYDROXY (VIT D DEFICIENCY, FRACTURES): Vit D, 25-Hydroxy: 39.5 ng/mL (ref 30.0–100.0)

## 2019-10-20 ENCOUNTER — Other Ambulatory Visit: Payer: Self-pay | Admitting: Physician Assistant

## 2019-10-20 DIAGNOSIS — E039 Hypothyroidism, unspecified: Secondary | ICD-10-CM

## 2019-10-20 DIAGNOSIS — E038 Other specified hypothyroidism: Secondary | ICD-10-CM

## 2019-10-20 MED ORDER — LEVOTHYROXINE SODIUM 25 MCG PO TABS: 25.0000 ug | ORAL_TABLET | Freq: Every day | ORAL | 1 refills | Status: DC

## 2019-11-12 ENCOUNTER — Encounter: Payer: Self-pay | Admitting: Physician Assistant

## 2019-11-12 DIAGNOSIS — E559 Vitamin D deficiency, unspecified: Secondary | ICD-10-CM

## 2019-11-12 MED ORDER — VITAMIN D (ERGOCALCIFEROL) 1.25 MG (50000 UNIT) PO CAPS: ORAL_CAPSULE | ORAL | 3 refills | Status: DC

## 2020-01-20 ENCOUNTER — Ambulatory Visit: Payer: 59 | Admitting: Physician Assistant

## 2020-04-10 ENCOUNTER — Ambulatory Visit: Payer: 59 | Attending: Internal Medicine

## 2020-04-10 DIAGNOSIS — Z23 Encounter for immunization: Secondary | ICD-10-CM

## 2020-04-10 NOTE — Progress Notes (Signed)
° °  Covid-19 Vaccination Clinic  Name:  [name]    MRN: [id number] DOB: [birth year]  04/10/2020  Mr. Dobek was observed post Covid-19 immunization for 15 minutes without incident. He was provided with Vaccine Information Sheet and instruction to access the V-Safe system.   Mr. Darley was instructed to call 911 with any severe reactions post vaccine:  Difficulty breathing   Swelling of face and throat   A fast heartbeat   A bad rash all over body   Dizziness and weakness

## 2020-04-23 ENCOUNTER — Other Ambulatory Visit: Payer: Self-pay | Admitting: Physician Assistant

## 2020-04-23 DIAGNOSIS — E038 Other specified hypothyroidism: Secondary | ICD-10-CM

## 2020-05-06 ENCOUNTER — Other Ambulatory Visit: Payer: Self-pay | Admitting: Physician Assistant

## 2020-05-06 DIAGNOSIS — E038 Other specified hypothyroidism: Secondary | ICD-10-CM

## 2020-06-23 ENCOUNTER — Other Ambulatory Visit: Payer: Self-pay | Admitting: Physician Assistant

## 2020-06-23 ENCOUNTER — Telehealth: Payer: Self-pay | Admitting: Physician Assistant

## 2020-06-23 DIAGNOSIS — E038 Other specified hypothyroidism: Secondary | ICD-10-CM

## 2020-06-23 NOTE — Telephone Encounter (Signed)
Please call patient to schedule apt for further refills. AS, CMA 

## 2020-06-25 ENCOUNTER — Telehealth: Payer: Self-pay | Admitting: Physician Assistant

## 2020-06-28 NOTE — Telephone Encounter (Signed)
Opened in error

## 2020-06-30 ENCOUNTER — Telehealth: Payer: Self-pay | Admitting: Physician Assistant

## 2020-06-30 ENCOUNTER — Encounter: Payer: Self-pay | Admitting: Physician Assistant

## 2020-06-30 DIAGNOSIS — E038 Other specified hypothyroidism: Secondary | ICD-10-CM

## 2020-06-30 MED ORDER — LEVOTHYROXINE SODIUM 25 MCG PO TABS: 25.0000 ug | ORAL_TABLET | Freq: Every day | ORAL | 0 refills | Status: DC

## 2020-06-30 NOTE — Telephone Encounter (Signed)
Med sent to pharmacy. AS, CMA 

## 2020-07-01 ENCOUNTER — Ambulatory Visit: Payer: 59 | Admitting: Physician Assistant

## 2020-07-27 ENCOUNTER — Ambulatory Visit: Payer: 59 | Admitting: Physician Assistant

## 2020-08-11 ENCOUNTER — Other Ambulatory Visit: Payer: Self-pay | Admitting: Physician Assistant

## 2020-08-11 DIAGNOSIS — E038 Other specified hypothyroidism: Secondary | ICD-10-CM

## 2020-08-16 ENCOUNTER — Ambulatory Visit (INDEPENDENT_AMBULATORY_CARE_PROVIDER_SITE_OTHER): Payer: 59 | Admitting: Physician Assistant

## 2020-08-16 ENCOUNTER — Encounter: Payer: Self-pay | Admitting: Physician Assistant

## 2020-08-16 VITALS — Ht 71.0 in | Wt 380.0 lb

## 2020-08-16 DIAGNOSIS — E559 Vitamin D deficiency, unspecified: Secondary | ICD-10-CM | POA: Diagnosis not present

## 2020-08-16 DIAGNOSIS — G4700 Insomnia, unspecified: Secondary | ICD-10-CM | POA: Diagnosis not present

## 2020-08-16 DIAGNOSIS — E038 Other specified hypothyroidism: Secondary | ICD-10-CM | POA: Diagnosis not present

## 2020-08-16 DIAGNOSIS — F418 Other specified anxiety disorders: Secondary | ICD-10-CM

## 2020-08-16 NOTE — Progress Notes (Signed)
Telehealth office visit note for Gregory Masker, PA-C- at Primary Care at [facility]   I connected with current patient today by telephone and verified that I am speaking with the correct person   . Location of the patient: Home . Location of the provider: Office - This visit type was conducted due to national recommendations for restrictions regarding the COVID-19 Pandemic (e.g. social distancing) in an effort to limit this patient's exposure and mitigate transmission in our community.    - No physical exam could be performed with this format, beyond that communicated to [place] by the patient/ family members as noted.   - Additionally my office staff/ schedulers were to discuss with the patient that there may be a monetary charge related to this service, depending on their medical insurance.  My understanding is that patient understood and consented to proceed.     _________________________________________________________________________________   History of Present Illness: Patient calls in to follow up on hypothyroidism. Patient reports medication compliance.  Denies fatigue, weight or bowel changes.  States is having trouble with sleep again.  Reports sleep schedule was on track until the past week.  Is having trouble falling asleep.  In the past has tried listening to meditation or calming sounds which have been ineffective.  Tries to limit caffeine use especially before bedtime.  Reports his body does not feel tired.  Anxiety: Tries to limit taking hydroxyzine for anxiety which was prescribed by last PCP before transferring care to [facility].  Performs breathing exercises and tries to use a distraction to help with anxiety.     GAD 7 : Generalized Anxiety Score 10/16/2019 10/15/2017 08/24/2017  Nervous, Anxious, on Edge 1 2 3   Control/stop worrying 0 3 3  Worry too much - different things 1 2 3   Trouble relaxing 0 1 3  Restless 0 1 1  Easily annoyed or irritable 1 2 1   Afraid -  awful might happen 1 2 3   Total GAD 7 Score 4 13 17   Anxiety Difficulty Not difficult at all - -    Depression screen [facility] 2/9 08/16/2020 10/16/2019 02/13/2019 02/25/2018 12/26/2017  Decreased Interest 0 1 1 1 2   Down, Depressed, Hopeless 0 1 0 2 1  PHQ - 2 Score 0 2 1 3 3   Altered sleeping 3 0 3 3 3   Tired, decreased energy 0 1 1 1 2   Change in appetite 0 1 0 2 1  Feeling bad or failure about yourself  0 1 1 3 2   Trouble concentrating 0 2 0 3 2  Moving slowly or fidgety/restless 0 1 0 1 1  Suicidal thoughts 0 0 0 0 0  PHQ-9 Score 3 8 6 16 14   Difficult doing work/chores - Not difficult at all Somewhat difficult Somewhat difficult Somewhat difficult      Impression and Recommendations:     1. Subclinical hypothyroidism   2. Situational anxiety   3. Insomnia, unspecified type     Subclinical hypothyroidism: -Last TSH 1.710, free T4 1.08 wnl -Advised patient to schedule lab visit to repeat thyroid labs.  Pending results will send additional refills of levothyroxine 25 mg or adjust dose if indicated.  Insomnia, unspecified type: -Encourage patient to go to sleep sooner to help reestablish circadian rhythm and/or incorporate physical activity before bedtime, use a sleep diary.  -Continue to limit caffeine use. -Will repeat thyroid labs to evaluation for potential changes.   Situational anxiety: -Controlled. -Continue to incorporate nonpharmacologic  therapy with deep breathing exercises and mindfulness therapy.   Vitamin D deficiency: -Last Vitamin D 39.5. Will repeat Vitamin D with FBW. Pending results will send additional refills or adjust treatment plan if indicated.  - As part of my medical decision making, I reviewed the following data within the electronic MEDICAL RECORD NUMBER History obtained from pt /family, CMA notes reviewed and incorporated if applicable, Labs reviewed, Radiograph/ tests reviewed if applicable and OV notes from prior OV's with me, as well as any other  specialists she/he has seen since seeing me last, were all reviewed and used in my medical decision making process today.    - Additionally, when appropriate, discussion had with patient regarding our treatment plan, and their biases/concerns about that plan were used in my medical decision making today.    - The patient agreed with the plan and demonstrated an understanding of the instructions.   No barriers to understanding were identified.     - The patient was advised to call back or seek an in-person evaluation if the symptoms worsen or if the condition fails to improve as anticipated.   Return in about 4 months (around 12/16/2020) for Insomnia, thyroid, mood; lab visit this week for FBW.    No orders of the defined types were placed in this encounter.   No orders of the defined types were placed in this encounter.   There are no discontinued medications.     Time spent on telephone encounter was 13 minutes.      The 21st Century Cures Act was signed into law in 2016 which includes the topic of electronic health records.  This provides immediate access to information in MyChart.  This includes consultation notes, operative notes, office notes, lab results and pathology reports.  If you have any questions about what you read please let us know at your next visit or call us at the office.  We are right here with you.  Note:  This note was prepared with assistance of Dragon voice recognition software. Occasional wrong-word or sound-a-like substitutions may have occurred due to the inherent limitations of voice recognition software.  __________________________________________________________________________________     Patient Care Team    Relationship Specialty Notifications Start End  Gregory Reed, [place] PCP - General   10/12/19   Eustaquio Boyden, MD Consulting Physician Family Medicine  10/15/17      -Vitals obtained; medications/ allergies reconciled;  personal medical,  social, Sx etc.histories were updated by CMA, reviewed by me and are reflected in chart   Patient Active Problem List   Diagnosis Date Noted  . Vitamin D deficiency 02/13/2019  . Poor motivation vs poor focus 02/25/2018  . Subclinical hypothyroidism 12/26/2017  . Elevated cholesterol with elevated triglycerides 12/26/2017  . Low HDL (under 40) 12/26/2017  . Anxious mood 10/15/2017  . Insomnia 10/15/2017  . Situational anxiety 08/24/2017  . Left ankle injury 02/16/2017  . Headache 01/29/2016  . Seasonal allergic rhinitis 05/11/2014  . Well adolescent visit 05/11/2014  . Morbid obesity with BMI of 50.0-59.9, adult (HCC)      Current Meds  Medication Sig  . fluticasone (FLONASE) 50 MCG/ACT nasal spray Place 2 sprays into both nostrils daily.  . hydrOXYzine (ATARAX/VISTARIL) 25 MG tablet Take 0.5-1 tablets (12.5-25 mg total) by mouth 3 (three) times daily as needed for anxiety.  Marland Kitchen levothyroxine (SYNTHROID) 25 MCG tablet TAKE 1 TABLET (25 MCG TOTAL) BY MOUTH DAILY. **NEEDS APT FOR FURTHER REFILLS**  . Vitamin D, Ergocalciferol, (DRISDOL) 1.25  MG (50000 UNIT) CAPS capsule Take one tablet wkly     Allergies:  Allergies  Allergen Reactions  . Codeine Nausea And Vomiting     ROS:  See above HPI for pertinent positives and negatives   Objective:   Height 5\' 11"  (1.803 m), weight (!) 380 lb (172.4 kg).  (if some vitals are omitted, this means that patient was UNABLE to obtain them.) General: A & O * 3; sounds in no acute distress Respiratory: speaking in full sentences, no conversational dyspnea Psych: insight appears good, mood- appears full

## 2020-08-16 NOTE — Patient Instructions (Signed)

## 2020-08-17 ENCOUNTER — Other Ambulatory Visit: Payer: Self-pay

## 2020-08-17 ENCOUNTER — Other Ambulatory Visit: Payer: 59

## 2020-08-18 ENCOUNTER — Other Ambulatory Visit: Payer: 59

## 2020-08-20 ENCOUNTER — Other Ambulatory Visit: Payer: Self-pay | Admitting: Physician Assistant

## 2020-08-20 DIAGNOSIS — E038 Other specified hypothyroidism: Secondary | ICD-10-CM

## 2020-08-20 LAB — TSH: TSH: 2.45 u[IU]/mL (ref 0.450–4.500)

## 2020-08-20 LAB — T3: T3, Total: 139 ng/dL (ref 71–180)

## 2020-08-20 LAB — T4, FREE: Free T4: 1.15 ng/dL (ref 0.82–1.77)

## 2020-08-20 MED ORDER — LEVOTHYROXINE SODIUM 25 MCG PO TABS: 25.0000 ug | ORAL_TABLET | Freq: Every day | ORAL | 1 refills | Status: DC

## 2020-08-24 ENCOUNTER — Ambulatory Visit: Payer: 59 | Admitting: Physician Assistant

## 2020-10-25 ENCOUNTER — Other Ambulatory Visit: Payer: Self-pay | Admitting: Physician Assistant

## 2020-10-25 ENCOUNTER — Telehealth: Payer: Self-pay | Admitting: Physician Assistant

## 2020-10-25 DIAGNOSIS — E559 Vitamin D deficiency, unspecified: Secondary | ICD-10-CM

## 2020-10-25 NOTE — Telephone Encounter (Signed)
Patient scheduled for June due to insurance reasons, patient did not want vitamin d labs.

## 2020-10-25 NOTE — Telephone Encounter (Signed)
Please contact pt to schedule per last AVS for med  Refills. Pt needs Vit D lab for refills also. AS, CMA

## 2020-11-10 ENCOUNTER — Encounter: Payer: Self-pay | Admitting: Physician Assistant

## 2020-11-10 DIAGNOSIS — E559 Vitamin D deficiency, unspecified: Secondary | ICD-10-CM

## 2020-11-10 MED ORDER — VITAMIN D (ERGOCALCIFEROL) 1.25 MG (50000 UNIT) PO CAPS: ORAL_CAPSULE | ORAL | 0 refills | Status: DC

## 2020-11-17 ENCOUNTER — Other Ambulatory Visit: Payer: Self-pay | Admitting: Physician Assistant

## 2020-11-17 DIAGNOSIS — E559 Vitamin D deficiency, unspecified: Secondary | ICD-10-CM

## 2020-12-09 ENCOUNTER — Ambulatory Visit (INDEPENDENT_AMBULATORY_CARE_PROVIDER_SITE_OTHER): Payer: PRIVATE HEALTH INSURANCE | Admitting: Physician Assistant

## 2020-12-09 ENCOUNTER — Other Ambulatory Visit: Payer: Self-pay

## 2020-12-09 ENCOUNTER — Encounter: Payer: Self-pay | Admitting: Physician Assistant

## 2020-12-09 VITALS — BP 128/86 | HR 72 | Temp 98.6°F | Ht 73.0 in | Wt 349.7 lb

## 2020-12-09 DIAGNOSIS — F418 Other specified anxiety disorders: Secondary | ICD-10-CM | POA: Diagnosis not present

## 2020-12-09 DIAGNOSIS — F320 Major depressive disorder, single episode, mild: Secondary | ICD-10-CM

## 2020-12-09 DIAGNOSIS — R109 Unspecified abdominal pain: Secondary | ICD-10-CM

## 2020-12-09 DIAGNOSIS — E038 Other specified hypothyroidism: Secondary | ICD-10-CM

## 2020-12-09 DIAGNOSIS — E782 Mixed hyperlipidemia: Secondary | ICD-10-CM | POA: Diagnosis not present

## 2020-12-09 DIAGNOSIS — E559 Vitamin D deficiency, unspecified: Secondary | ICD-10-CM

## 2020-12-09 NOTE — Progress Notes (Signed)
Established Patient Office Visit  Subjective:  Patient ID: Gregory Reed, male    DOB: [birth year]  Age: 20 y.o. MRN: [id number]  CC:  Chief Complaint  Patient presents with   Follow-up   Thyroid Problem    HPI Gregory Reed presents for follow up on  hypothyroid, mood and vitamin D deficiency.  Patient has c/o intermittent left-sided abdominal pain for 2 months.  Reports has not been able to identify any specific triggers, pain comes and goes.  No worsening factors.  Denies nausea, vomiting, altered bowels, fever or belching. Reports a few weeks ago was a soft and liquid diet due to wisdom tooth extractions which likely contributed to some of his weight loss. Denies significant diet changes or increasing physical activity.  Hypothyroid.  Reports medication compliance.  Denies palpitations, hair loss or bowel changes.  Mood: States no longer taking hydroxyzine. Overall he is able to manage his anxiety which has been stable. Reports having more issues with feeling depressed versus anxiety. States depression is triggered by certain situations or if something goes wrong which then would also trigger his anxiety.  When he feels that way he usually likes to stay in his bed and sleep.  Denies SI/HI.  In the past has been to therapy but it is challenging to talk about himself and open up.  Vitamin D deficiency: Requesting refill of vitamin D.  Past Medical History:  Diagnosis Date   Eczema    History of pneumonia 2005, 2006   Obesity     Past Surgical History:  Procedure Laterality Date   TONSILLECTOMY  2005    Family History  Problem Relation Age of Onset   Aneurysm Paternal Grandfather        thoracic aortic   CVA Paternal Grandfather    CVA Paternal Uncle    Cancer Other        breast - maternal great grandmother   Arthritis Father    Crohn's disease Other        maternal side   Diabetes Neg Hx    CAD Neg Hx     Social History   Socioeconomic History   Marital status:  Single    Spouse name: Not on file   Number of children: Not on file   Years of education: Not on file   Highest education level: Not on file  Occupational History   Not on file  Tobacco Use   Smoking status: Never   Smokeless tobacco: Never  Vaping Use   Vaping Use: Never used  Substance and Sexual Activity   Alcohol use: No    Alcohol/week: 0.0 standard drinks   Drug use: No   Sexual activity: Never  Other Topics Concern   Not on file  Social History Narrative   Home schooled   Lives with mom and dad at home, grandparents live up the driveway   6 dogs, 11 cats, chickens   Activity - no regular exercise.   Diet: no water, large sodas and juices, good fruits/vegetables   Social Determinants of Health   Financial Resource Strain: Not on file  Food Insecurity: Not on file  Transportation Needs: Not on file  Physical Activity: Not on file  Stress: Not on file  Social Connections: Not on file  Intimate Partner Violence: Not on file    Outpatient Medications Prior to Visit  Medication Sig Dispense Refill   fluticasone (FLONASE) 50 MCG/ACT nasal spray Place 2 sprays into both nostrils daily. 16 g 11  levothyroxine (SYNTHROID) 25 MCG tablet Take 1 tablet (25 mcg total) by mouth daily. 90 tablet 1   Vitamin D, Ergocalciferol, (DRISDOL) 1.25 MG (50000 UNIT) CAPS capsule **NEEDS APT FOR REFILLS** TAKE 1 CAPSULE BY MOUTH ONCE A WEEK 4 capsule 0   hydrOXYzine (ATARAX/VISTARIL) 25 MG tablet Take 0.5-1 tablets (12.5-25 mg total) by mouth 3 (three) times daily as needed for anxiety. (Patient not taking: Reported on 12/09/2020) 30 tablet 0   No facility-administered medications prior to visit.    Allergies  Allergen Reactions   Codeine Nausea And Vomiting    ROS Review of Systems A fourteen system review of systems was performed and found to be positive as per HPI.   Objective:    Physical Exam General:  Well Developed, well nourished, appropriate for stated age.  Neuro:   Alert and oriented,  extra-ocular muscles intact  HEENT:  Normocephalic, atraumatic, neck supple Abdomen: Protuberant, soft, NBS, tenderness to deep palpation of LUQ and LLQ, no tenderness to light palpation, no guarding or rebound tenderness  Skin:  no gross rash, warm, pink. Cardiac:  RRR Respiratory:  ECTA B/L, Not using accessory muscles, speaking in full sentences- unlabored. Vascular:  Ext warm, no cyanosis apprec.; cap RF less 2 sec. Psych:  No HI/SI, judgement and insight good, Euthymic mood. Full Affect.  BP 128/86   Pulse 72   Temp 98.6 F (37 C)   Ht 6\' 1"  (1.854 m)   Wt (!) 349 lb 11.2 oz (158.6 kg)   SpO2 97%   BMI 46.14 kg/m  Wt Readings from Last 3 Encounters:  12/09/20 (!) 349 lb 11.2 oz (158.6 kg)  08/16/20 (!) 380 lb (172.4 kg)  10/16/19 (!) 365 lb 4.8 oz (165.7 kg)     Health Maintenance Due  Topic Date Due   HPV VACCINES (1 - Male 2-dose series) Never done   HIV Screening  Never done   Hepatitis C Screening  Never done       Topic Date Due   HPV VACCINES (1 - Male 2-dose series) Never done    Lab Results  Component Value Date   TSH 2.450 08/18/2020   Lab Results  Component Value Date   WBC 9.6 02/11/2019   HGB 14.6 02/11/2019   HCT 45.0 02/11/2019   MCV 91 02/11/2019   PLT 246 02/11/2019   Lab Results  Component Value Date   NA 142 10/16/2019   K 4.3 10/16/2019   CO2 24 10/16/2019   GLUCOSE 87 10/16/2019   BUN 13 10/16/2019   CREATININE 0.87 10/16/2019   BILITOT 0.4 10/16/2019   ALKPHOS 107 10/16/2019   AST 22 10/16/2019   ALT 28 10/16/2019   PROT 6.6 10/16/2019   ALBUMIN 4.1 10/16/2019   CALCIUM 9.1 10/16/2019   GFR 122.81 02/01/2016   Lab Results  Component Value Date   CHOL 156 10/16/2019   Lab Results  Component Value Date   HDL 46 10/16/2019   Lab Results  Component Value Date   LDLCALC 90 10/16/2019   Lab Results  Component Value Date   TRIG 113 10/16/2019   Lab Results  Component Value Date   CHOLHDL 3.4  10/16/2019   Lab Results  Component Value Date   HGBA1C 5.2 02/11/2019      Assessment & Plan:   Problem List Items Addressed This Visit       Endocrine   Subclinical hypothyroidism - Primary     Other   Situational anxiety   Elevated  cholesterol with elevated triglycerides   Vitamin D deficiency   Other Visit Diagnoses     Morbid obesity (HCC)       Left sided abdominal pain       Relevant Orders   US Abdomen Complete   Current mild episode of major depressive disorder, unspecified whether recurrent (HCC)           Situational anxiety, Current mild episode of major depressive disorder, unspecified whether recurrent: -GAD-7 score of 6, PHQ-9 score of 9. -Discussed with patient management options and prefers nonpharmacological therapy at this time.  Recommend to reconsider [facility] therapy.  Use good support system at home. -If symptoms worsen or fail to improve recommend to consider medication therapy. -Will continue to monitor.  Subclinical hypothyroidism: -Last TSH wnl. -Continue current medication regimen. -Rechecking thyroid labs with fasting lab visit.  Pending results will adjust treatment plan if indicated or send additional refills.  Elevated cholesterol with elevated triglycerides: -Last lipid panel: Total cholesterol 156, triglycerides 113, HDL 46, LDL 90 -Recommend to follow a low-fat diet and gradually increase physical activity. -Will repeat lipid panel with fasting lab visit.  Left-sided abdominal pain: -Etiology unclear. Will place order for abdominal ultrasound for further evaluation.  Vitamin D deficiency: -Last vitamin D 39.5, will repeat vitamin D with lab visit.  Pending results will send additional refills or adjust treatment plan if indicated.  Morbid obesity: -Patient has lost 31 pounds since last visit. -Recommend to continue dietary and lifestyle modifications.  No orders of the defined types were placed in this encounter.   Follow-up:  Return in about 4 months (around 04/10/2021) for CPE; lab visit tomorrow for FBW including Vit D.   Note:  This note was prepared with assistance of Dragon voice recognition software. Occasional wrong-word or sound-a-like substitutions may have occurred due to the inherent limitations of voice recognition software.  Mayer Masker, PA-C

## 2020-12-10 ENCOUNTER — Other Ambulatory Visit: Payer: Self-pay

## 2020-12-10 DIAGNOSIS — E782 Mixed hyperlipidemia: Secondary | ICD-10-CM

## 2020-12-10 DIAGNOSIS — Z0000 Encounter for general adult medical examination without abnormal findings: Secondary | ICD-10-CM

## 2020-12-10 DIAGNOSIS — E559 Vitamin D deficiency, unspecified: Secondary | ICD-10-CM

## 2020-12-10 DIAGNOSIS — E038 Other specified hypothyroidism: Secondary | ICD-10-CM

## 2020-12-11 LAB — COMPREHENSIVE METABOLIC PANEL
ALT: 22 IU/L (ref 0–44)
AST: 18 IU/L (ref 0–40)
Albumin/Globulin Ratio: 2 (ref 1.2–2.2)
Albumin: 4.5 g/dL (ref 4.1–5.2)
Alkaline Phosphatase: 127 IU/L — ABNORMAL HIGH (ref 44–121)
BUN/Creatinine Ratio: 12 (ref 9–20)
BUN: 11 mg/dL (ref 6–20)
Bilirubin Total: 0.4 mg/dL (ref 0.0–1.2)
CO2: 18 mmol/L — ABNORMAL LOW (ref 20–29)
Calcium: 9.4 mg/dL (ref 8.7–10.2)
Chloride: 103 mmol/L (ref 96–106)
Creatinine, Ser: 0.9 mg/dL (ref 0.76–1.27)
Globulin, Total: 2.2 g/dL (ref 1.5–4.5)
Glucose: 94 mg/dL (ref 65–99)
Potassium: 4.1 mmol/L (ref 3.5–5.2)
Sodium: 142 mmol/L (ref 134–144)
Total Protein: 6.7 g/dL (ref 6.0–8.5)
eGFR: 125 mL/min/{1.73_m2} (ref >59)

## 2020-12-11 LAB — CBC
Hematocrit: 45.9 % (ref 37.5–51.0)
Hemoglobin: 15.2 g/dL (ref 13.0–17.7)
MCH: 29.6 pg (ref 26.6–33.0)
MCHC: 33.1 g/dL (ref 31.5–35.7)
MCV: 89 fL (ref 79–97)
Platelets: 191 10*3/uL (ref 150–450)
RBC: 5.14 x10E6/uL (ref 4.14–5.80)
RDW: 12.1 % (ref 11.6–15.4)
WBC: 8.1 10*3/uL (ref 3.4–10.8)

## 2020-12-11 LAB — LIPID PANEL
Chol/HDL Ratio: 3.3 ratio (ref 0.0–5.0)
Cholesterol, Total: 164 mg/dL (ref 100–199)
HDL: 49 mg/dL (ref >39)
LDL Chol Calc (NIH): 97 mg/dL (ref 0–99)
Triglycerides: 95 mg/dL (ref 0–149)
VLDL Cholesterol Cal: 18 mg/dL (ref 5–40)

## 2020-12-11 LAB — T4, FREE: Free T4: 1.2 ng/dL (ref 0.82–1.77)

## 2020-12-11 LAB — HEMOGLOBIN A1C
Est. average glucose Bld gHb Est-mCnc: 100 mg/dL
Hgb A1c MFr Bld: 5.1 % (ref 4.8–5.6)

## 2020-12-11 LAB — VITAMIN D 25 HYDROXY (VIT D DEFICIENCY, FRACTURES): Vit D, 25-Hydroxy: 32.4 ng/mL (ref 30.0–100.0)

## 2020-12-11 LAB — TSH: TSH: 2.58 u[IU]/mL (ref 0.450–4.500)

## 2020-12-11 LAB — T3: T3, Total: 163 ng/dL (ref 71–180)

## 2020-12-16 ENCOUNTER — Other Ambulatory Visit: Payer: Self-pay | Admitting: Physician Assistant

## 2020-12-16 DIAGNOSIS — E559 Vitamin D deficiency, unspecified: Secondary | ICD-10-CM

## 2020-12-16 DIAGNOSIS — R109 Unspecified abdominal pain: Secondary | ICD-10-CM

## 2020-12-16 DIAGNOSIS — E038 Other specified hypothyroidism: Secondary | ICD-10-CM

## 2020-12-16 MED ORDER — LEVOTHYROXINE SODIUM 25 MCG PO TABS: 25.0000 ug | ORAL_TABLET | Freq: Every day | ORAL | 1 refills | Status: DC

## 2020-12-16 MED ORDER — VITAMIN D (ERGOCALCIFEROL) 1.25 MG (50000 UNIT) PO CAPS: ORAL_CAPSULE | ORAL | 0 refills | Status: DC

## 2020-12-21 ENCOUNTER — Other Ambulatory Visit: Payer: Self-pay | Admitting: Physician Assistant

## 2020-12-21 DIAGNOSIS — E559 Vitamin D deficiency, unspecified: Secondary | ICD-10-CM

## 2020-12-30 ENCOUNTER — Other Ambulatory Visit: Payer: Self-pay

## 2021-01-01 ENCOUNTER — Telehealth: Payer: PRIVATE HEALTH INSURANCE | Admitting: Nurse Practitioner

## 2021-01-01 DIAGNOSIS — U071 COVID-19: Secondary | ICD-10-CM | POA: Diagnosis not present

## 2021-01-01 MED ORDER — BENZONATATE 100 MG PO CAPS: 100.0000 mg | ORAL_CAPSULE | Freq: Three times a day (TID) | ORAL | 0 refills | Status: DC | PRN

## 2021-01-01 MED ORDER — NIRMATRELVIR/RITONAVIR (PAXLOVID)TABLET: 3.0000 | ORAL_TABLET | Freq: Two times a day (BID) | ORAL | 0 refills | Status: AC

## 2021-01-01 NOTE — Patient Instructions (Signed)
You are being prescribed PAXLOVID for COVID-19 infection.       Please pick up your prescription at: [facility]    Please call the pharmacy or go through the drive through vs going inside if you are picking up the mediation yourself to prevent further spread. If prescribed to a [facility] affiliated pharmacy, a pharmacist will bring the medication out to your car.   Medications to hold while taking this treatment: none  *If asked to hold, you can resume them 24 hours after your last dose   ADMINISTRATION INSTRUCTIONS: Take with or without food. Swallow the tablets whole. Don't chew, crush, or break the medications because it might not work as well  For each dose of the medication, you should be taking 3 tablets together (2 pink oval and 1 white oval) TWICE a day for FIVE days   Finish your full five-day course of Paxlovid even if you feel better before you're done. Stopping this medication too early can make it less effective to prevent severe illness related to COVID19.    Paxlovid is prescribed for YOU ONLY. Don't share it with others, even if they have similar symptoms as you. This medication might not be right for everyone.  Make sure to take steps to protect yourself and others while you're taking this medication in order to get well soon and to prevent others from getting sick with COVID-19.  Paxlovid (nirmatrelvir / ritonavir) can cause hormonal birth control medications to not work well. If you or your partner is currently taking hormonal birth control, use condoms or other birth control methods to prevent unintended pregnancies.    COMMON SIDE EFFECTS: Altered or bad taste in your mouth  Diarrhea  High blood pressure (1% of people) Muscle aches (1% of people)     If your COVID-19 symptoms get worse, get medical help right away. Call 911 if you experience symptoms such as worsening cough, trouble breathing, chest pain that doesn't go away, confusion, a hard  time staying awake, and pale or blue-colored skin. This medication won't prevent all COVID-19 cases from getting worse.

## 2021-01-01 NOTE — Progress Notes (Signed)
Virtual Visit Consent   Gregory Reed, you are scheduled for a virtual visit with Mary-Margaret Daphine Deutscher, FNP, a [facility] provider, today.     Just as with appointments in the office, your consent must be obtained to participate.  Your consent will be active for this visit and any virtual visit you may have with one of our providers in the next 365 days.     If you have a MyChart account, a copy of this consent can be sent to you electronically.  All virtual visits are billed to your insurance company just like a traditional visit in the office.    As this is a virtual visit, video technology does not allow for your provider to perform a traditional examination.  This may limit your provider's ability to fully assess your condition.  If your provider identifies any concerns that need to be evaluated in person or the need to arrange testing (such as labs, EKG, etc.), we will make arrangements to do so.     Although advances in technology are sophisticated, we cannot ensure that it will always work on either your end or our end.  If the connection with a video visit is poor, the visit may have to be switched to a telephone visit.  With either a video or telephone visit, we are not always able to ensure that we have a secure connection.     I need to obtain your verbal consent now.   Are you willing to proceed with your visit today? YES   Gregory Reed has provided verbal consent on 01/01/2021 for a virtual visit (video or telephone).   Mary-Margaret Daphine Deutscher, FNP   Date: 01/01/2021 1:02 PM   Virtual Visit via Video Note   I, Mary-Margaret Daphine Deutscher, connected with Gregory Reed ([id number], 22-17-2002) on 01/01/21 at  1:15 PM EDT by a video-enabled telemedicine application and verified that I am speaking with the correct person using two identifiers.  Location: Patient: Virtual Visit Location Patient: Home Provider: Virtual Visit Location Provider: Mobile   I discussed the limitations of  evaluation and management by telemedicine and the availability of in person appointments. The patient expressed understanding and agreed to proceed.    History of Present Illness: Gregory Reed is a 20 y.o. who identifies as a male who was assigned male at birth, and is being seen today for Covid.  HPI: Patient states that yesterday he developed scratchy throat. Developed a fever of 100.3 and cough. The cough is nonproductive at this time.   Review of Systems  Constitutional:  Positive for chills and fever.  Respiratory:  Positive for cough and shortness of breath. Negative for sputum production.   Musculoskeletal:  Positive for myalgias.  Neurological:  Positive for headaches.   Problems:  Patient Active Problem List   Diagnosis Date Noted   Vitamin D deficiency 02/13/2019   Poor motivation vs poor focus 02/25/2018   Subclinical hypothyroidism 12/26/2017   Elevated cholesterol with elevated triglycerides 12/26/2017   Low HDL (under 40) 12/26/2017   Anxious mood 10/15/2017   Insomnia 10/15/2017   Situational anxiety 08/24/2017   Left ankle injury 02/16/2017   Headache 01/29/2016   Seasonal allergic rhinitis 05/11/2014   Well adolescent visit 05/11/2014   Morbid obesity with BMI of 50.0-59.9, adult (HCC)     Allergies:  Allergies  Allergen Reactions   Codeine Nausea And Vomiting   Medications:  Current Outpatient Medications:    fluticasone (FLONASE) 50 MCG/ACT nasal spray, Place 2 sprays  into both nostrils daily., Disp: 16 g, Rfl: 11   levothyroxine (SYNTHROID) 25 MCG tablet, Take 1 tablet (25 mcg total) by mouth daily., Disp: 90 tablet, Rfl: 1   Vitamin D, Ergocalciferol, (DRISDOL) 1.25 MG (50000 UNIT) CAPS capsule, TAKE 1 CAPSULE BY MOUTH ONCE WEEKLY., Disp: 12 capsule, Rfl: 0  Observations/Objective: Patient is well-developed, well-nourished in no acute distress.  Resting comfortably  at home.  Head is normocephalic, atraumatic.  No labored breathing. Dry cough Speech  is clear and coherent with logical content.  Patient is alert and oriented at baseline.  Voice hoarse  Assessment and Plan:  Gregory Reed in today with chief complaint of No chief complaint on file.   1. Lab test positive for detection of COVID-19 virus 1. Take meds as prescribed 2. Use a cool mist humidifier especially during the winter months and when heat has been humid. 3. Use saline nose sprays frequently 4. Saline irrigations of the nose can be very helpful if done frequently.  * 4X daily for 1 week*  * Use of a nettie pot can be helpful with this. Follow directions with this* 5. Drink plenty of fluids 6. Keep thermostat turn down low 7.For any cough or congestion    * Children- consult with Pharmacist for dosing 8. For fever or aces or pains- take tylenol or ibuprofen appropriate for age and weight.  * for fevers greater than 101 orally you may alternate ibuprofen and tylenol every  3 hours.    - nirmatrelvir/ritonavir EUA (PAXLOVID) TABS; Take 3 tablets by mouth 2 (two) times daily for 5 days. (Take nirmatrelvir 150 mg two tablets twice daily for 5 days and ritonavir 100 mg one tablet twice daily for 5 days) Patient GFR is 124  Dispense: 30 tablet; Refill: 0 - benzonatate (TESSALON PERLES) 100 MG capsule; Take 1 capsule (100 mg total) by mouth 3 (three) times daily as needed.  Dispense: 20 capsule; Refill: 0      Follow Up Instructions: I discussed the assessment and treatment plan with the patient. The patient was provided an opportunity to ask questions and all were answered. The patient agreed with the plan and demonstrated an understanding of the instructions.  A copy of instructions were sent to the patient via MyChart.  The patient was advised to call back or seek an in-person evaluation if the symptoms worsen or if the condition fails to improve as anticipated.  Time:  I spent 10 minutes with the patient via telehealth technology discussing the above  problems/concerns.    Mary-Margaret Daphine Deutscher, FNP

## 2021-04-06 ENCOUNTER — Encounter: Payer: Self-pay | Admitting: Physician Assistant

## 2021-04-06 ENCOUNTER — Other Ambulatory Visit: Payer: Self-pay

## 2021-04-06 ENCOUNTER — Ambulatory Visit (INDEPENDENT_AMBULATORY_CARE_PROVIDER_SITE_OTHER): Payer: PRIVATE HEALTH INSURANCE | Admitting: Physician Assistant

## 2021-04-06 VITALS — BP 136/76 | HR 80 | Temp 98.0°F | Ht 73.0 in | Wt 375.0 lb

## 2021-04-06 DIAGNOSIS — Z23 Encounter for immunization: Secondary | ICD-10-CM

## 2021-04-06 DIAGNOSIS — E038 Other specified hypothyroidism: Secondary | ICD-10-CM

## 2021-04-06 DIAGNOSIS — E559 Vitamin D deficiency, unspecified: Secondary | ICD-10-CM

## 2021-04-06 DIAGNOSIS — R41840 Attention and concentration deficit: Secondary | ICD-10-CM

## 2021-04-06 MED ORDER — VITAMIN D (ERGOCALCIFEROL) 1.25 MG (50000 UNIT) PO CAPS
ORAL_CAPSULE | ORAL | 0 refills | Status: DC
Start: 2021-04-06 — End: 2021-07-05

## 2021-04-06 NOTE — Assessment & Plan Note (Signed)
-  Continue Vitamin D supplement. Will repeat Vitamin D with CPE.

## 2021-04-06 NOTE — Progress Notes (Signed)
Established Patient Office Visit  Subjective:  Patient ID: Gregory Reed, male    DOB: [birth year]  Age: 20 y.o. MRN: [id number]  CC:  Chief Complaint  Patient presents with   Follow-up   Thyroid Problem    HPI Gregory Reed presents for follow up on hypothyroidism. States mood and anxiety continue to be fine. Denies SI/HI. Reports has been trying to be more active, went golfing yesterday with his dad. Also trying to watch what he eats such as limiting fried foods and snacks (potato chips). States his intermittent abdominal pain has improved. Reports has trouble with concentrating and focusing which has been a chronic issue since childhood. Also reports starting tasks and not completing that task and then start something else. Sometimes has trouble with active listening during a conversation without getting distracted. Feels fidgety at times.  Past Medical History:  Diagnosis Date   Eczema    History of pneumonia 2005, 2006   Obesity     Past Surgical History:  Procedure Laterality Date   TONSILLECTOMY  2005    Family History  Problem Relation Age of Onset   Aneurysm Paternal Grandfather        thoracic aortic   CVA Paternal Grandfather    CVA Paternal Uncle    Cancer Other        breast - maternal great grandmother   Arthritis Father    Crohn's disease Other        maternal side   Diabetes Neg Hx    CAD Neg Hx     Social History   Socioeconomic History   Marital status: Single    Spouse name: Not on file   Number of children: Not on file   Years of education: Not on file   Highest education level: Not on file  Occupational History   Not on file  Tobacco Use   Smoking status: Never   Smokeless tobacco: Never  Vaping Use   Vaping Use: Never used  Substance and Sexual Activity   Alcohol use: No    Alcohol/week: 0.0 standard drinks   Drug use: No   Sexual activity: Never  Other Topics Concern   Not on file  Social History Narrative   Home schooled   Lives  with mom and dad at home, grandparents live up the driveway   6 dogs, 11 cats, chickens   Activity - no regular exercise.   Diet: no water, large sodas and juices, good fruits/vegetables   Social Determinants of Health   Financial Resource Strain: Not on file  Food Insecurity: Not on file  Transportation Needs: Not on file  Physical Activity: Not on file  Stress: Not on file  Social Connections: Not on file  Intimate Partner Violence: Not on file    Outpatient Medications Prior to Visit  Medication Sig Dispense Refill   fluticasone (FLONASE) 50 MCG/ACT nasal spray Place 2 sprays into both nostrils daily. 16 g 11   levothyroxine (SYNTHROID) 25 MCG tablet Take 1 tablet (25 mcg total) by mouth daily. 90 tablet 1   Vitamin D, Ergocalciferol, (DRISDOL) 1.25 MG (50000 UNIT) CAPS capsule TAKE 1 CAPSULE BY MOUTH ONCE WEEKLY. 12 capsule 0   benzonatate (TESSALON PERLES) 100 MG capsule Take 1 capsule (100 mg total) by mouth 3 (three) times daily as needed. 20 capsule 0   No facility-administered medications prior to visit.    Allergies  Allergen Reactions   Codeine Nausea And Vomiting    ROS Review of Systems  A fourteen system review of systems was performed and found to be positive as per HPI.  Objective:    Physical Exam General:  Pleasant and cooperative, in no acute distress   Neuro:  Alert and oriented,  extra-ocular muscles intact  HEENT:  Normocephalic, atraumatic, neck supple  Skin:  no gross rash, warm, pink. Cardiac:  RRR, S1 S2 Respiratory: CTA B/L, Not using accessory muscles, speaking in full sentences- unlabored. Vascular:  Ext warm, no cyanosis apprec.; cap RF less 2 sec. Psych:  No HI/SI, judgement and insight good, Euthymic mood. Full Affect.  BP 136/76   Pulse 80   Temp 98 F (36.7 C)   Ht '6\' 1"'  (1.854 m)   Wt (!) 375 lb (170.1 kg)   SpO2 97%   BMI 49.48 kg/m  Wt Readings from Last 3 Encounters:  04/06/21 (!) 375 lb (170.1 kg)  12/09/20 (!) 349 lb  11.2 oz (158.6 kg)  08/16/20 (!) 380 lb (172.4 kg)     Health Maintenance Due  Topic Date Due   HPV VACCINES (1 - Male 2-dose series) Never done   HIV Screening  Never done   Hepatitis C Screening  Never done   COVID-19 Vaccine (4 - Booster for Pfizer series) 06/05/2020       Topic Date Due   HPV VACCINES (1 - Male 2-dose series) Never done    Lab Results  Component Value Date   TSH 2.580 12/10/2020   Lab Results  Component Value Date   WBC 8.1 12/10/2020   HGB 15.2 12/10/2020   HCT 45.9 12/10/2020   MCV 89 12/10/2020   PLT 191 12/10/2020   Lab Results  Component Value Date   NA 142 12/10/2020   K 4.1 12/10/2020   CO2 18 (L) 12/10/2020   GLUCOSE 94 12/10/2020   BUN 11 12/10/2020   CREATININE 0.90 12/10/2020   BILITOT 0.4 12/10/2020   ALKPHOS 127 (H) 12/10/2020   AST 18 12/10/2020   ALT 22 12/10/2020   PROT 6.7 12/10/2020   ALBUMIN 4.5 12/10/2020   CALCIUM 9.4 12/10/2020   EGFR 125 12/10/2020   GFR 122.81 02/01/2016   Lab Results  Component Value Date   CHOL 164 12/10/2020   Lab Results  Component Value Date   HDL 49 12/10/2020   Lab Results  Component Value Date   LDLCALC 97 12/10/2020   Lab Results  Component Value Date   TRIG 95 12/10/2020   Lab Results  Component Value Date   CHOLHDL 3.3 12/10/2020   Lab Results  Component Value Date   HGBA1C 5.1 12/10/2020      Assessment & Plan:   Problem List Items Addressed This Visit       Endocrine   Subclinical hypothyroidism - Primary    -Last TSH wnl -Continue current medication regimen. -Will repeat thyroid labs with CPE.         Other   Vitamin D deficiency    -Continue Vitamin D supplement. Will repeat Vitamin D with CPE.      Relevant Medications   Vitamin D, Ergocalciferol, (DRISDOL) 1.25 MG (50000 UNIT) CAPS capsule   Other Visit Diagnoses     Need for influenza vaccination       Relevant Orders   Flu Vaccine QUAD 59moIM (Fluarix, Fluzone & Alfiuria Quad PF)  (Completed)   Impaired concentration       Relevant Orders   Ambulatory referral to Psychology   Obesity, morbid, BMI 40.0-49.9 ([place]  Impaired concentration: -Will place referral to [facility] for further evaluation.   Obesity, morbid, BMI 40.0-49.9: -Discussed with patient dietary changes such as limiting saturated fats, simple carbohydrates and sugar intake and continue to increase physical activity.  Meds ordered this encounter  Medications   Vitamin D, Ergocalciferol, (DRISDOL) 1.25 MG (50000 UNIT) CAPS capsule    Sig: TAKE 1 CAPSULE BY MOUTH ONCE WEEKLY.    Dispense:  12 capsule    Refill:  0    Order Specific Question:   Supervising Provider    Answer:   Beatrice Lecher D [[id number]    Follow-up: Return in about 3 months (around 07/07/2021), or CPE and FBW.    Lorrene Reid, PA-C

## 2021-04-06 NOTE — Assessment & Plan Note (Signed)
-  Last TSH wnl -Continue current medication regimen. -Will repeat thyroid labs with CPE.

## 2021-04-06 NOTE — Patient Instructions (Signed)
Vitamin D Deficiency ?Vitamin D deficiency is when your body does not have enough vitamin D. Vitamin D is important to your body for many reasons: ?It helps the body absorb two important minerals--calcium and phosphorus. ?It plays a role in bone health. ?It may help to prevent some diseases, such as diabetes and multiple sclerosis. ?It plays a role in muscle function, including heart function. ?If vitamin D deficiency is severe, it can cause a condition in which your bones become soft. In adults, this condition is called osteomalacia. In children, this condition is called rickets. ?What are the causes? ?This condition may be caused by: ?Not eating enough foods that contain vitamin D. ?Not getting enough natural sun exposure. ?Having certain digestive system diseases that make it difficult for your body to absorb vitamin D. These diseases include Crohn's disease, chronic pancreatitis, and cystic fibrosis. ?Having a surgery in which a part of the stomach or a part of the small intestine is removed. ?Having chronic kidney disease or liver disease. ?What increases the risk? ?You are more likely to develop this condition if you: ?Are older. ?Do not spend much time outdoors. ?Live in a long-term care facility. ?Have had broken bones. ?Have weak or thin bones (osteoporosis). ?Have a disease or condition that changes how the body absorbs vitamin D. ?Have dark skin. ?Take certain medicines, such as steroid medicines or certain seizure medicines. ?Are overweight or obese. ?What are the signs or symptoms? ?In mild cases of vitamin D deficiency, there may not be any symptoms. If the condition is severe, symptoms may include: ?Bone pain. ?Muscle pain. ?Falling often. ?Broken bones caused by a minor injury. ?How is this diagnosed? ?This condition may be diagnosed with blood tests. Imaging tests such as X-rays may also be done to look for changes in the bone. ?How is this treated? ?Treatment for this condition may depend on what  caused the condition. Treatment options include: ?Taking vitamin D supplements. Your health care provider will suggest what dose is best for you. ?Taking a calcium supplement. Your health care provider will suggest what dose is best for you. ?Follow these instructions at home: ?Eating and drinking ? ?Eat foods that contain vitamin D. Choices include: ?Fortified dairy products, cereals, or juices. Fortified means that vitamin D has been added to the food. Check the label on the package to see if the food is fortified. ?Fatty fish, such as salmon or trout. ?Eggs. ?Oysters. ?Mushrooms. ?The items listed above may not be a complete list of recommended foods and beverages. Contact a dietitian for more information. ?General instructions ?Take medicines and supplements only as told by your health care provider. ?Get regular, safe exposure to natural sunlight. ?Do not use a tanning bed. ?Maintain a healthy weight. Lose weight if needed. ?Keep all follow-up visits as told by your health care provider. This is important. ?How is this prevented? ?You can get vitamin D by: ?Eating foods that naturally contain vitamin D. ?Eating or drinking products that have been fortified with vitamin D, such as cereals, juices, and dairy products (including milk). ?Taking a vitamin D supplement or a multivitamin supplement that contains vitamin D. ?Being in the sun. Your body naturally makes vitamin D when your skin is exposed to sunlight. Your body changes the sunlight into a form of the vitamin that it can use. ?Contact a health care provider if: ?Your symptoms do not go away. ?You feel nauseous or you vomit. ?You have fewer bowel movements than usual or are constipated. ?Summary ?Vitamin   D deficiency is when your body does not have enough vitamin D. ?Vitamin D is important to your body for good bone health and muscle function, and it may help prevent some diseases. ?Vitamin D deficiency is primarily treated through supplementation. Your  health care provider will suggest what dose is best for you. ?You can get vitamin D by eating foods that contain vitamin D, by being in the sun, and by taking a vitamin D supplement or a multivitamin supplement that contains vitamin D. ?This information is not intended to replace advice given to you by your health care provider. Make sure you discuss any questions you have with your health care provider. ?Document Revised: 02/04/2018 Document Reviewed: 02/04/2018 ?Elsevier Patient Education ? 2022 Elsevier Inc. ? ?

## 2021-07-05 ENCOUNTER — Encounter: Payer: Self-pay | Admitting: Physician Assistant

## 2021-07-05 DIAGNOSIS — E559 Vitamin D deficiency, unspecified: Secondary | ICD-10-CM

## 2021-07-05 MED ORDER — VITAMIN D (ERGOCALCIFEROL) 1.25 MG (50000 UNIT) PO CAPS
ORAL_CAPSULE | ORAL | 0 refills | Status: DC
Start: 2021-07-05 — End: 2021-07-27

## 2021-07-06 DIAGNOSIS — L02421 Furuncle of right axilla: Secondary | ICD-10-CM | POA: Diagnosis not present

## 2021-07-06 DIAGNOSIS — L700 Acne vulgaris: Secondary | ICD-10-CM | POA: Diagnosis not present

## 2021-07-06 DIAGNOSIS — B9689 Other specified bacterial agents as the cause of diseases classified elsewhere: Secondary | ICD-10-CM | POA: Diagnosis not present

## 2021-07-08 ENCOUNTER — Ambulatory Visit (INDEPENDENT_AMBULATORY_CARE_PROVIDER_SITE_OTHER): Payer: BC Managed Care – PPO | Admitting: Physician Assistant

## 2021-07-08 ENCOUNTER — Other Ambulatory Visit: Payer: Self-pay

## 2021-07-08 ENCOUNTER — Encounter: Payer: Self-pay | Admitting: Physician Assistant

## 2021-07-08 VITALS — BP 127/80 | HR 68 | Temp 97.7°F | Ht 73.0 in | Wt 384.0 lb

## 2021-07-08 DIAGNOSIS — E782 Mixed hyperlipidemia: Secondary | ICD-10-CM | POA: Diagnosis not present

## 2021-07-08 DIAGNOSIS — E038 Other specified hypothyroidism: Secondary | ICD-10-CM

## 2021-07-08 DIAGNOSIS — E559 Vitamin D deficiency, unspecified: Secondary | ICD-10-CM

## 2021-07-08 DIAGNOSIS — Z0000 Encounter for general adult medical examination without abnormal findings: Secondary | ICD-10-CM

## 2021-07-08 NOTE — Patient Instructions (Signed)
Preventive Care 21-23 Years Old, Male ?Preventive care refers to lifestyle choices and visits with your health care provider that can promote health and wellness. Preventive care visits are also called wellness exams. ?What can I expect for my preventive care visit? ?Counseling ?During your preventive care visit, your health care provider may ask about your: ?Medical history, including: ?Past medical problems. ?Family medical history. ?Current health, including: ?Emotional well-being. ?Home life and relationship well-being. ?Sexual activity. ?Lifestyle, including: ?Alcohol, nicotine or tobacco, and drug use. ?Access to firearms. ?Diet, exercise, and sleep habits. ?Safety issues such as seatbelt and bike helmet use. ?Sunscreen use. ?Work and work environment. ?Physical exam ?Your health care provider may check your: ?Height and weight. These may be used to calculate your BMI (body mass index). BMI is a measurement that tells if you are at a healthy weight. ?Waist circumference. This measures the distance around your waistline. This measurement also tells if you are at a healthy weight and may help predict your risk of certain diseases, such as type 2 diabetes and high blood pressure. ?Heart rate and blood pressure. ?Body temperature. ?Skin for abnormal spots. ?What immunizations do I need? ?Vaccines are usually given at various ages, according to a schedule. Your health care provider will recommend vaccines for you based on your age, medical history, and lifestyle or other factors, such as travel or where you work. ?What tests do I need? ?Screening ?Your health care provider may recommend screening tests for certain conditions. This may include: ?Lipid and cholesterol levels. ?Diabetes screening. This is done by checking your blood sugar (glucose) after you have not eaten for a while (fasting). ?Hepatitis B test. ?Hepatitis C test. ?HIV (human immunodeficiency virus) test. ?STI (sexually transmitted infection)  testing, if you are at risk. ?Talk with your health care provider about your test results, treatment options, and if necessary, the need for more tests. ?Follow these instructions at home: ?Eating and drinking ? ?Eat a healthy diet that includes fresh fruits and vegetables, whole grains, lean protein, and low-fat dairy products. ?Drink enough fluid to keep your urine pale yellow. ?Take vitamin and mineral supplements as recommended by your health care provider. ?Do not drink alcohol if your health care provider tells you not to drink. ?If you drink alcohol: ?Limit how much you have to 0-2 drinks a day. ?Know how much alcohol is in your drink. In the [place] one drink equals one 12 oz bottle of beer (355 mL), one 5 oz glass of wine (148 mL), or one 1? oz glass of hard liquor (44 mL). ?Lifestyle ?Brush your teeth every morning and night with fluoride toothpaste. Floss one time each day. ?Exercise for at least 30 minutes 5 or more days each week. ?Do not use any products that contain nicotine or tobacco. These products include cigarettes, chewing tobacco, and vaping devices, such as e-cigarettes. If you need help quitting, ask your health care provider. ?Do not use drugs. ?If you are sexually active, practice safe sex. Use a condom or other form of protection to prevent STIs. ?Find healthy ways to manage stress, such as: ?Meditation, yoga, or listening to music. ?Journaling. ?Talking to a trusted person. ?Spending time with friends and family. ?Minimize exposure to UV radiation to reduce your risk of skin cancer. ?Safety ?Always wear your seat belt while driving or riding in a vehicle. ?Do not drive: ?If you have been drinking alcohol. Do not ride with someone who has been drinking. ?If you have been using any mind-altering substances or   drugs. ?While texting. ?When you are tired or distracted. ?Wear a helmet and other protective equipment during sports activities. ?If you have firearms in your house, make sure you  follow all gun safety procedures. ?Seek help if you have been physically or sexually abused. ?What's next? ?Go to your health care provider once a year for an annual wellness visit. ?Ask your health care provider how often you should have your eyes and teeth checked. ?Stay up to date on all vaccines. ?This information is not intended to replace advice given to you by your health care provider. Make sure you discuss any questions you have with your health care provider. ?Document Revised: 11/24/2020 Document Reviewed: 11/24/2020 ?Elsevier Patient Education ? 2022 Elsevier Inc. ? ?

## 2021-07-08 NOTE — Progress Notes (Signed)
Complete physical exam   Patient: Gregory Reed   DOB: 19-Nov-2000   21 y.o. Male  MRN: [id number] Visit Date: 07/08/2021   Chief Complaint  Patient presents with   Annual Exam   Subjective    Gregory Reed is a 21 y.o. male who presents today for a complete physical exam.  He reports consuming a general diet but has been eating out more lately. The patient does not participate in regular exercise at present. He generally feels fairly well. He does not have additional problems to discuss today.   Recently saw dermatology and was started topical antibiotic gel and doxycycline.   Depression screen [facility] 2/9 07/08/2021 04/06/2021 12/09/2020 08/16/2020 10/16/2019  Decreased Interest '1 1 1 ' 0 1  Down, Depressed, Hopeless '1 1 1 ' 0 1  PHQ - 2 Score '2 2 2 ' 0 2  Altered sleeping '3 1 3 3 ' 0  Tired, decreased energy 1 0 1 0 1  Change in appetite 3 0 1 0 1  Feeling bad or failure about yourself  '1 1 1 ' 0 1  Trouble concentrating '1 1 1 ' 0 2  Moving slowly or fidgety/restless 0 0 0 0 1  Suicidal thoughts 0 0 0 0 0  PHQ-9 Score '11 5 9 3 8  ' Difficult doing work/chores Somewhat difficult Somewhat difficult Somewhat difficult - Not difficult at all  Some recent data might be hidden   GAD 7 : Generalized Anxiety Score 07/08/2021 04/06/2021 12/09/2020 10/16/2019  Nervous, Anxious, on Edge 1 0 1 1  Control/stop worrying 1 0 1 0  Worry too much - different things '1 1 1 1  ' Trouble relaxing 1 0 0 0  Restless '1 1 1 ' 0  Easily annoyed or irritable '1 1 1 1  ' Afraid - awful might happen 0 '1 1 1  ' Total GAD 7 Score '6 4 6 4  ' Anxiety Difficulty Not difficult at all Somewhat difficult Somewhat difficult Not difficult at all    Past Medical History:  Diagnosis Date   Eczema    History of pneumonia 2005, 2006   Obesity    Past Surgical History:  Procedure Laterality Date   TONSILLECTOMY  2005   Social History   Socioeconomic History   Marital status: Single    Spouse name: Not on file   Number of children: Not on  file   Years of education: Not on file   Highest education level: Not on file  Occupational History   Not on file  Tobacco Use   Smoking status: Never   Smokeless tobacco: Never  Vaping Use   Vaping Use: Never used  Substance and Sexual Activity   Alcohol use: No    Alcohol/week: 0.0 standard drinks   Drug use: No   Sexual activity: Never  Other Topics Concern   Not on file  Social History Narrative   Home schooled   Lives with mom and dad at home, grandparents live up the driveway   6 dogs, 11 cats, chickens   Activity - no regular exercise.   Diet: no water, large sodas and juices, good fruits/vegetables   Social Determinants of Health   Financial Resource Strain: Not on file  Food Insecurity: Not on file  Transportation Needs: Not on file  Physical Activity: Not on file  Stress: Not on file  Social Connections: Not on file  Intimate Partner Violence: Not on file     Medications: Outpatient Medications Prior to Visit  Medication Sig   fluticasone (FLONASE)  50 MCG/ACT nasal spray Place 2 sprays into both nostrils daily.   levothyroxine (SYNTHROID) 25 MCG tablet Take 1 tablet (25 mcg total) by mouth daily.   Vitamin D, Ergocalciferol, (DRISDOL) 1.25 MG (50000 UNIT) CAPS capsule TAKE 1 CAPSULE BY MOUTH ONCE WEEKLY.   No facility-administered medications prior to visit.    Review of Systems Review of Systems:  A fourteen system review of systems was performed and found to be positive as per HPI.    Objective    BP 127/80    Pulse 68    Temp 97.7 F (36.5 C)    Ht '6\' 1"'  (1.854 m)    Wt (!) 384 lb (174.2 kg)    SpO2 99%    BMI 50.66 kg/m    Physical Exam   General Appearance:    Alert, cooperative, in no acute distress, appears stated age  Head:    Normocephalic, without obvious abnormality, atraumatic  Eyes:    PERRL, conjunctiva/corneas clear, EOM's intact, fundi    benign, both eyes       Ears:    Normal TM's and external ear canals, both ears  Nose:    Nares normal, septum midline, mucosa normal, no drainage   or sinus tenderness  Throat:   Lips, mucosa, and tongue normal; teeth and gums normal  Neck:   Supple, symmetrical, trachea midline, no adenopathy;       thyroid:  No enlargement/tenderness/nodules appreciated  Back:     Symmetric, no curvature, ROM normal, no CVA tenderness  Lungs:     Clear to auscultation bilaterally, respirations unlabored  Chest wall:    No tenderness or deformity  Heart:    Normal heart rate. Normal rhythm. No murmurs, rubs, or gallops.  S1 and S2 normal  Abdomen:     Soft, non-tender, bowel sounds active all four quadrants,    no masses, no organomegaly  Genitalia:    deferred  Rectal:    deferred  Extremities:   All extremities are intact. No cyanosis or edema  Pulses:   2+ and symmetric all extremities  Skin:   Skin color, texture, turgor normal, no rashes or lesions  Lymph nodes:   Cervical and supraclavicular nodes normal  Neurologic:   CNII-XII grossly intact     Last depression screening scores PHQ 2/9 Scores 07/08/2021 04/06/2021 12/09/2020  PHQ - 2 Score '2 2 2  ' PHQ- 9 Score '11 5 9   ' Last fall risk screening Fall Risk  07/08/2021  Falls in the past year? 0  Number falls in past yr: 0  Injury with Fall? 0  Risk for fall due to : No Fall Risks  Follow up Falls evaluation completed     No results found for any visits on 07/08/21.  Assessment & Plan    Routine Health Maintenance and Physical Exam  Exercise Activities and Dietary recommendations -Discussed heart healthy diet low in fat and carbohydrates. Recommend to increase physical activity.   Immunization History  Administered Date(s) Administered   DTaP 03/15/1999, 05/25/1999, 07/22/1999, 04/23/2000   Hepatitis B 02/15/1999, 07/22/1999, 10/17/1999   HiB (PRP-OMP) 03/15/1999, 05/25/1999, 07/22/1999, 01/20/2000   IPV 03/15/1999, 05/25/1999, 04/23/2000   Influenza,inj,Quad PF,6+ Mos 05/11/2014, 02/15/2019, 04/06/2021    Influenza-Unspecified 02/15/2019   MMR 01/20/2000, 08/04/2003   Meningococcal Conjugate 05/11/2014, 05/11/2014, 01/28/2016   PFIZER(Purple Top)SARS-COV-2 Vaccination 09/04/2019, 09/29/2019, 04/10/2020   Pneumococcal Conjugate-13 05/25/1999, 07/22/1999, 10/20/1999, 01/20/2000   Tdap 05/11/2014   Varicella 04/23/2000, 06/26/2014    Health Maintenance  Topic  Date Due   HPV VACCINES (1 - Male 2-dose series) Never done   HIV Screening  Never done   Hepatitis C Screening  Never done   COVID-19 Vaccine (4 - Booster for Pfizer series) 06/05/2020   TETANUS/TDAP  05/11/2024   INFLUENZA VACCINE  Completed    Discussed health benefits of physical activity, and encouraged him to engage in regular exercise appropriate for his age and condition.  Problem List Items Addressed This Visit       Endocrine   Subclinical hypothyroidism   Relevant Orders   TSH   T3   T4, free     Other   Elevated cholesterol with elevated triglycerides   Relevant Orders   Lipid panel   Vitamin D deficiency   Relevant Orders   VITAMIN D 25 Hydroxy (Vit-D Deficiency, Fractures)   Other Visit Diagnoses     Healthcare maintenance    -  Primary   Relevant Orders   VITAMIN D 25 Hydroxy (Vit-D Deficiency, Fractures)   TSH   T3   T4, free   Comprehensive metabolic panel   CBC with Differential/Platelet   Hemoglobin A1c   Lipid panel      Patient reports acute situational stress reaction to friendship conflict and defers mood treatment therapy. No SI/HI. Advised to contact the office or follow-up if mood fails to improve. Patient verbalized understanding.  Schedule lab visit in 1 week for routine fasting blood work.   Return in about 6 months (around 01/05/2022) for Thyroid, mood; lab visit for FBW in 1 week .       Lorrene Reid, PA-C  [facility] [phone] (phone) [phone] (fax)  [place]

## 2021-07-15 ENCOUNTER — Other Ambulatory Visit: Payer: BC Managed Care – PPO

## 2021-07-15 ENCOUNTER — Other Ambulatory Visit: Payer: Self-pay

## 2021-07-15 DIAGNOSIS — E559 Vitamin D deficiency, unspecified: Secondary | ICD-10-CM | POA: Diagnosis not present

## 2021-07-15 DIAGNOSIS — E038 Other specified hypothyroidism: Secondary | ICD-10-CM | POA: Diagnosis not present

## 2021-07-15 DIAGNOSIS — E782 Mixed hyperlipidemia: Secondary | ICD-10-CM

## 2021-07-15 DIAGNOSIS — Z0000 Encounter for general adult medical examination without abnormal findings: Secondary | ICD-10-CM | POA: Diagnosis not present

## 2021-07-16 LAB — LIPID PANEL
Chol/HDL Ratio: 3.9 ratio (ref 0.0–5.0)
Cholesterol, Total: 158 mg/dL (ref 100–199)
HDL: 41 mg/dL (ref >39)
LDL Chol Calc (NIH): 95 mg/dL (ref 0–99)
Triglycerides: 120 mg/dL (ref 0–149)
VLDL Cholesterol Cal: 22 mg/dL (ref 5–40)

## 2021-07-16 LAB — T4, FREE: Free T4: 1.15 ng/dL (ref 0.82–1.77)

## 2021-07-16 LAB — CBC WITH DIFFERENTIAL/PLATELET
Basophils Absolute: 0.1 10*3/uL (ref 0.0–0.2)
Basos: 1 %
EOS (ABSOLUTE): 0.1 10*3/uL (ref 0.0–0.4)
Eos: 1 %
Hematocrit: 40.7 % (ref 37.5–51.0)
Hemoglobin: 14.1 g/dL (ref 13.0–17.7)
Immature Grans (Abs): 0.1 10*3/uL (ref 0.0–0.1)
Immature Granulocytes: 1 %
Lymphocytes Absolute: 3.9 10*3/uL — ABNORMAL HIGH (ref 0.7–3.1)
Lymphs: 35 %
MCH: 30.5 pg (ref 26.6–33.0)
MCHC: 34.6 g/dL (ref 31.5–35.7)
MCV: 88 fL (ref 79–97)
Monocytes Absolute: 0.9 10*3/uL (ref 0.1–0.9)
Monocytes: 8 %
Neutrophils Absolute: 5.9 10*3/uL (ref 1.4–7.0)
Neutrophils: 54 %
Platelets: 296 10*3/uL (ref 150–450)
RBC: 4.63 x10E6/uL (ref 4.14–5.80)
RDW: 12.1 % (ref 11.6–15.4)
WBC: 11 10*3/uL — ABNORMAL HIGH (ref 3.4–10.8)

## 2021-07-16 LAB — COMPREHENSIVE METABOLIC PANEL
ALT: 25 IU/L (ref 0–44)
AST: 21 IU/L (ref 0–40)
Albumin/Globulin Ratio: 2.1 (ref 1.2–2.2)
Albumin: 4.4 g/dL (ref 4.1–5.2)
Alkaline Phosphatase: 113 IU/L (ref 44–121)
BUN/Creatinine Ratio: 17 (ref 9–20)
BUN: 17 mg/dL (ref 6–20)
Bilirubin Total: 0.2 mg/dL (ref 0.0–1.2)
CO2: 22 mmol/L (ref 20–29)
Calcium: 9.6 mg/dL (ref 8.7–10.2)
Chloride: 104 mmol/L (ref 96–106)
Creatinine, Ser: 0.98 mg/dL (ref 0.76–1.27)
Globulin, Total: 2.1 g/dL (ref 1.5–4.5)
Glucose: 101 mg/dL — ABNORMAL HIGH (ref 70–99)
Potassium: 4.2 mmol/L (ref 3.5–5.2)
Sodium: 141 mmol/L (ref 134–144)
Total Protein: 6.5 g/dL (ref 6.0–8.5)
eGFR: 112 mL/min/{1.73_m2} (ref >59)

## 2021-07-16 LAB — HEMOGLOBIN A1C
Est. average glucose Bld gHb Est-mCnc: 111 mg/dL
Hgb A1c MFr Bld: 5.5 % (ref 4.8–5.6)

## 2021-07-16 LAB — TSH: TSH: 2.97 u[IU]/mL (ref 0.450–4.500)

## 2021-07-16 LAB — T3: T3, Total: 179 ng/dL (ref 71–180)

## 2021-07-16 LAB — VITAMIN D 25 HYDROXY (VIT D DEFICIENCY, FRACTURES): Vit D, 25-Hydroxy: 26.8 ng/mL — ABNORMAL LOW (ref 30.0–100.0)

## 2021-07-27 ENCOUNTER — Other Ambulatory Visit: Payer: Self-pay | Admitting: Physician Assistant

## 2021-07-27 DIAGNOSIS — E559 Vitamin D deficiency, unspecified: Secondary | ICD-10-CM

## 2021-08-15 ENCOUNTER — Other Ambulatory Visit: Payer: Self-pay

## 2021-08-15 DIAGNOSIS — E038 Other specified hypothyroidism: Secondary | ICD-10-CM

## 2021-08-15 DIAGNOSIS — Z0000 Encounter for general adult medical examination without abnormal findings: Secondary | ICD-10-CM

## 2021-08-15 DIAGNOSIS — E782 Mixed hyperlipidemia: Secondary | ICD-10-CM

## 2021-08-17 ENCOUNTER — Other Ambulatory Visit: Payer: BC Managed Care – PPO

## 2021-08-17 ENCOUNTER — Other Ambulatory Visit: Payer: Self-pay

## 2021-08-17 DIAGNOSIS — E782 Mixed hyperlipidemia: Secondary | ICD-10-CM

## 2021-08-17 DIAGNOSIS — E038 Other specified hypothyroidism: Secondary | ICD-10-CM

## 2021-08-17 DIAGNOSIS — Z0000 Encounter for general adult medical examination without abnormal findings: Secondary | ICD-10-CM | POA: Diagnosis not present

## 2021-08-18 LAB — COMPREHENSIVE METABOLIC PANEL
ALT: 38 IU/L (ref 0–44)
AST: 23 IU/L (ref 0–40)
Albumin/Globulin Ratio: 1.9 (ref 1.2–2.2)
Albumin: 4.4 g/dL (ref 4.1–5.2)
Alkaline Phosphatase: 108 IU/L (ref 44–121)
BUN/Creatinine Ratio: 15 (ref 9–20)
BUN: 15 mg/dL (ref 6–20)
Bilirubin Total: 0.3 mg/dL (ref 0.0–1.2)
CO2: 23 mmol/L (ref 20–29)
Calcium: 9.5 mg/dL (ref 8.7–10.2)
Chloride: 99 mmol/L (ref 96–106)
Creatinine, Ser: 1.02 mg/dL (ref 0.76–1.27)
Globulin, Total: 2.3 g/dL (ref 1.5–4.5)
Glucose: 95 mg/dL (ref 70–99)
Potassium: 4.3 mmol/L (ref 3.5–5.2)
Sodium: 137 mmol/L (ref 134–144)
Total Protein: 6.7 g/dL (ref 6.0–8.5)
eGFR: 107 mL/min/{1.73_m2} (ref >59)

## 2021-08-18 LAB — CBC WITH DIFFERENTIAL/PLATELET
Basophils Absolute: 0.1 10*3/uL (ref 0.0–0.2)
Basos: 1 %
EOS (ABSOLUTE): 0.1 10*3/uL (ref 0.0–0.4)
Eos: 1 %
Hematocrit: 43.4 % (ref 37.5–51.0)
Hemoglobin: 14.6 g/dL (ref 13.0–17.7)
Immature Grans (Abs): 0.1 10*3/uL (ref 0.0–0.1)
Immature Granulocytes: 1 %
Lymphocytes Absolute: 3.4 10*3/uL — ABNORMAL HIGH (ref 0.7–3.1)
Lymphs: 34 %
MCH: 29.4 pg (ref 26.6–33.0)
MCHC: 33.6 g/dL (ref 31.5–35.7)
MCV: 87 fL (ref 79–97)
Monocytes Absolute: 0.9 10*3/uL (ref 0.1–0.9)
Monocytes: 9 %
Neutrophils Absolute: 5.5 10*3/uL (ref 1.4–7.0)
Neutrophils: 54 %
Platelets: 300 10*3/uL (ref 150–450)
RBC: 4.97 x10E6/uL (ref 4.14–5.80)
RDW: 12.1 % (ref 11.6–15.4)
WBC: 10.1 10*3/uL (ref 3.4–10.8)

## 2021-08-18 LAB — LIPID PANEL
Chol/HDL Ratio: 3.7 ratio (ref 0.0–5.0)
Cholesterol, Total: 147 mg/dL (ref 100–199)
HDL: 40 mg/dL (ref >39)
LDL Chol Calc (NIH): 90 mg/dL (ref 0–99)
Triglycerides: 90 mg/dL (ref 0–149)
VLDL Cholesterol Cal: 17 mg/dL (ref 5–40)

## 2021-08-18 LAB — TSH: TSH: 2.09 u[IU]/mL (ref 0.450–4.500)

## 2021-08-18 LAB — HEMOGLOBIN A1C
Est. average glucose Bld gHb Est-mCnc: 105 mg/dL
Hgb A1c MFr Bld: 5.3 % (ref 4.8–5.6)

## 2021-08-22 ENCOUNTER — Encounter: Payer: Self-pay | Admitting: Physician Assistant

## 2021-09-13 DIAGNOSIS — L700 Acne vulgaris: Secondary | ICD-10-CM | POA: Diagnosis not present

## 2021-09-16 ENCOUNTER — Encounter: Payer: Self-pay | Admitting: Physician Assistant

## 2021-09-19 ENCOUNTER — Other Ambulatory Visit: Payer: Self-pay

## 2021-09-19 DIAGNOSIS — E559 Vitamin D deficiency, unspecified: Secondary | ICD-10-CM

## 2021-09-19 DIAGNOSIS — E038 Other specified hypothyroidism: Secondary | ICD-10-CM

## 2021-09-19 MED ORDER — VITAMIN D (ERGOCALCIFEROL) 1.25 MG (50000 UNIT) PO CAPS: 50000.0000 [IU] | ORAL_CAPSULE | ORAL | 0 refills | Status: DC

## 2021-09-19 MED ORDER — LEVOTHYROXINE SODIUM 25 MCG PO TABS: 25.0000 ug | ORAL_TABLET | Freq: Every day | ORAL | 1 refills | Status: DC

## 2021-12-31 ENCOUNTER — Other Ambulatory Visit: Payer: Self-pay | Admitting: Physician Assistant

## 2021-12-31 DIAGNOSIS — E559 Vitamin D deficiency, unspecified: Secondary | ICD-10-CM

## 2021-12-31 DIAGNOSIS — E038 Other specified hypothyroidism: Secondary | ICD-10-CM

## 2022-01-05 ENCOUNTER — Encounter: Payer: Self-pay | Admitting: Physician Assistant

## 2022-01-05 ENCOUNTER — Ambulatory Visit (INDEPENDENT_AMBULATORY_CARE_PROVIDER_SITE_OTHER): Payer: BC Managed Care – PPO | Admitting: Physician Assistant

## 2022-01-05 VITALS — BP 118/72 | HR 66 | Ht 72.84 in | Wt 397.0 lb

## 2022-01-05 DIAGNOSIS — E038 Other specified hypothyroidism: Secondary | ICD-10-CM | POA: Diagnosis not present

## 2022-01-05 DIAGNOSIS — E559 Vitamin D deficiency, unspecified: Secondary | ICD-10-CM | POA: Diagnosis not present

## 2022-01-05 DIAGNOSIS — E782 Mixed hyperlipidemia: Secondary | ICD-10-CM

## 2022-01-05 DIAGNOSIS — F418 Other specified anxiety disorders: Secondary | ICD-10-CM

## 2022-01-05 NOTE — Assessment & Plan Note (Addendum)
-  Last Vitamin D 26.8, patient on Vitamin D 50,000 units daily. Will repeat Vit D, pending lab results will adjust treatment plan if indicated.

## 2022-01-05 NOTE — Assessment & Plan Note (Signed)
-  Last lipid panel wnl's. Will repeat lipid panel today. Recommend to follow a hearth healthy diet as best as possibly given current circumstances. Will continue to monitor.

## 2022-01-05 NOTE — Progress Notes (Signed)
Established patient visit   Patient: Gregory Reed   DOB: [birth year]   21 y.o. Male  MRN: [id number] Visit Date: 01/05/2022  Chief Complaint  Patient presents with   Follow-up   thyroid   Subjective    HPI  Patient presents for follow-up on mood. Patient states it has been stressful around the house with his grandparents health declining quickly so has been eating out more or processed foods. Denies severe or extreme anxiety. States anxiety has been stable and manageable. States his issue with concentration has been worse with increased stress. Did contact [facility] but they failed to contact him for an appointment. Taking thyroid and Vitamin D medications.     01/05/2022   11:23 AM 07/08/2021    9:19 AM 04/06/2021    1:30 PM 12/09/2020    3:24 PM 08/16/2020    9:24 AM  Depression screen PHQ 2/9  Decreased Interest _0 0  Down, Depressed, Hopeless _1 0  PHQ - 2 Score _2 0  Altered sleeping 0 _3 Tired, decreased energy 1 1 0 1 0  Change in appetite 0 3 0 1 0  Feeling bad or failure about yourself  _4 0  Trouble concentrating _5 0  Moving slowly or fidgety/restless 0 0 0 0 0  Suicidal thoughts 0 0 0 0 0  PHQ-9 Score _6 Difficult doing work/chores  Somewhat difficult Somewhat difficult Somewhat difficult       01/05/2022   11:24 AM 07/08/2021    9:19 AM 04/06/2021    1:30 PM 12/09/2020    3:25 PM  GAD 7 : Generalized Anxiety Score  Nervous, Anxious, on Edge 1 1 0 1  Control/stop worrying 1 1 0 1  Worry too much - different things _7 Trouble relaxing 1 1 0 0  Restless 0 _8 Easily annoyed or irritable _9 Afraid - awful might happen 1 0 1 1  Total GAD 7 Score _10 Anxiety Difficulty  Not difficult at all Somewhat difficult Somewhat difficult        Medications: Outpatient Medications Prior to Visit  Medication Sig   fluticasone (FLONASE) 50 MCG/ACT nasal spray Place 2 sprays into both nostrils  daily.   levothyroxine (SYNTHROID) 25 MCG tablet TAKE 1 TABLET BY MOUTH EVERY DAY   Vitamin D, Ergocalciferol, (DRISDOL) 1.25 MG (50000 UNIT) CAPS capsule TAKE 1 CAPSULE BY MOUTH ONE TIME PER WEEK   No facility-administered medications prior to visit.    Review of Systems Review of Systems:  A fourteen system review of systems was performed and found to be positive as per HPI.  Last CBC Lab Results  Component Value Date   WBC 10.1 08/17/2021   HGB 14.6 08/17/2021   HCT 43.4 08/17/2021   MCV 87 08/17/2021   MCH 29.4 08/17/2021   RDW 12.1 08/17/2021   PLT 300 27/11/2374   Last metabolic panel Lab Results  Component Value Date   GLUCOSE 95 08/17/2021   NA 137 08/17/2021   K 4.3 08/17/2021   CL 99 08/17/2021   CO2 23 08/17/2021   BUN 15 08/17/2021   CREATININE 1.02 08/17/2021   EGFR 107 08/17/2021   CALCIUM 9.5 08/17/2021   PROT 6.7 08/17/2021   ALBUMIN 4.4 08/17/2021   LABGLOB 2.3 08/17/2021  AGRATIO 1.9 08/17/2021   BILITOT 0.3 08/17/2021   ALKPHOS 108 08/17/2021   AST 23 08/17/2021   ALT 38 08/17/2021   Last lipids Lab Results  Component Value Date   CHOL 147 08/17/2021   HDL 40 08/17/2021   LDLCALC 90 08/17/2021   LDLDIRECT 82 02/11/2019   TRIG 90 08/17/2021   CHOLHDL 3.7 08/17/2021   Last hemoglobin A1c Lab Results  Component Value Date   HGBA1C 5.3 08/17/2021   Last thyroid functions Lab Results  Component Value Date   TSH 2.090 08/17/2021   T3TOTAL 179 07/15/2021     Objective    BP 118/72   Pulse 66   Ht 6' 0.84" (1.85 m)   Wt (!) 397 lb (180.1 kg)   SpO2 98%   BMI 52.62 kg/m  BP Readings from Last 3 Encounters:  01/05/22 118/72  07/08/21 127/80  04/06/21 136/76   Wt Readings from Last 3 Encounters:  01/05/22 (!) 397 lb (180.1 kg)  07/08/21 (!) 384 lb (174.2 kg)  04/06/21 (!) 375 lb (170.1 kg)    Physical Exam  General: Pleasant and cooperative, in no acute distress, appropriate for stated age.  Neuro:  Alert and oriented,   extra-ocular muscles intact  HEENT:  Normocephalic, atraumatic, neck supple  Skin:  no gross rash, warm, pink. Cardiac:  RRR, S1 S2 Respiratory: CTA B/L  Vascular:  Ext warm, no cyanosis apprec.; cap RF less 2 sec. Psych:  No HI/SI, judgement and insight good, Euthymic mood. Full Affect.   No results found for any visits on 01/05/22.  Assessment & Plan      Problem List Items Addressed This Visit       Endocrine   Subclinical hypothyroidism - Primary    -Last TSH wnl at 2.090. -Continue current medication regimen. -Rechecking thyroid labs today. Pending results will make medication adjustments if indicated.       Relevant Orders   Comp Met (CMET)   TSH   T4, free     Other   Situational anxiety    -Stable. GAD-7 at patient's baseline. Patient wants to manage non-pharmacologically. Will continue to monitor.      Elevated cholesterol with elevated triglycerides    -Last lipid panel wnl's. Will repeat lipid panel today. Recommend to follow a hearth healthy diet as best as possibly given current circumstances. Will continue to monitor.      Relevant Orders   Lipid Profile   Vitamin D deficiency    -Last Vitamin D 26.8, patient on Vitamin D 50,000 units daily. Will repeat Vit D, pending lab results will adjust treatment plan if indicated.       Relevant Orders   Vitamin D (25 hydroxy)   Comp Met (CMET)    Return in about 6 months (around 07/08/2022) for CPE and FBW.        Lorrene Reid, PA-C  [facility] at [facility] [phone] (phone) [phone] (fax)  [place]

## 2022-01-05 NOTE — Assessment & Plan Note (Signed)
-  Stable. GAD-7 at patient's baseline. Patient wants to manage non-pharmacologically. Will continue to monitor.

## 2022-01-05 NOTE — Assessment & Plan Note (Signed)
-  Last TSH wnl at 2.090. -Continue current medication regimen. -Rechecking thyroid labs today. Pending results will make medication adjustments if indicated.

## 2022-01-05 NOTE — Patient Instructions (Signed)
Vitamin D Deficiency Vitamin D deficiency is when your body does not have enough vitamin D. Vitamin D is important because: It helps your body use certain minerals. It helps to keep your bones healthy. It lessens irritation and swelling (inflammation). It helps the body's defense system (immune system) work better. Not getting enough vitamin D can make your bones soft. What are the causes? Not eating enough foods that have vitamin D in them. Not getting enough sun. Having diseases that make it hard for your body to take in vitamin D. Having had part of your stomach or part of your small intestine taken out. What increases the risk? Being an older adult. Not spending much time outdoors. Living in a long-term care center. Having dark skin. Taking certain medicines. Being overweight or very overweight (obese). Having long-term (chronic) kidney or liver disease. What are the signs or symptoms? In mild cases, there may be no symptoms. If the condition is very bad, symptoms may include: Bone pain. Muscle pain. Not being able to walk normally. Bones that break easily. Joint pain. How is this treated? Treatment may include taking supplements as told by your doctor. Your doctor will tell you what dose is best for you. This may include taking: Vitamin D. Calcium. Follow these instructions at home: Eating and drinking Eat foods that have vitamin D in them, such as: Dairy products, cereals, or juices that have vitamin D added to them (are fortified). Check the label. Fish, such as salmon or trout. Eggs. The vitamin D is in the yolk. Mushrooms that were treated with UV light. Beef liver. The items listed above may not be a complete list of foods and beverages you can eat and drink. Contact a dietitian for more information. General instructions Take over-the-counter and prescription medicines only as told by your doctor. Take supplements only as told by your doctor. Get sunlight in a  safe way. Do not use a tanning bed. Stay at a healthy weight. Lose weight if you need to. Keep all follow-up visits. How is this prevented? Eating foods that naturally have vitamin D in them. Eating or drinking foods and drinks that have vitamin D added to them, such as cereals, juices, and milk. Taking vitamin D or a multivitamin that has vitamin D in it. Being in the sun. Your body makes vitamin D when your skin gets sunlight. Contact a doctor if: Your symptoms do not go away. You feel like you may vomit (nauseous). You vomit. You poop less often than normal, or you have trouble pooping (constipation). Summary Vitamin D deficiency is when your body does not have enough vitamin D. Vitamin D helps to keep your bones healthy. This condition is often treated by taking a supplement. Your doctor will tell you what dose is best for you. This information is not intended to replace advice given to you by your health care provider. Make sure you discuss any questions you have with your health care provider. Document Revised: 03/04/2021 Document Reviewed: 03/04/2021 Elsevier Patient Education  2023 Elsevier Inc.  

## 2022-01-06 LAB — COMPREHENSIVE METABOLIC PANEL
ALT: 32 IU/L (ref 0–44)
AST: 24 IU/L (ref 0–40)
Albumin/Globulin Ratio: 1.7 (ref 1.2–2.2)
Albumin: 4.3 g/dL (ref 4.3–5.2)
Alkaline Phosphatase: 113 IU/L (ref 44–121)
BUN/Creatinine Ratio: 13 (ref 9–20)
BUN: 12 mg/dL (ref 6–20)
Bilirubin Total: 0.4 mg/dL (ref 0.0–1.2)
CO2: 22 mmol/L (ref 20–29)
Calcium: 9.3 mg/dL (ref 8.7–10.2)
Chloride: 102 mmol/L (ref 96–106)
Creatinine, Ser: 0.9 mg/dL (ref 0.76–1.27)
Globulin, Total: 2.5 g/dL (ref 1.5–4.5)
Glucose: 85 mg/dL (ref 70–99)
Potassium: 4.3 mmol/L (ref 3.5–5.2)
Sodium: 140 mmol/L (ref 134–144)
Total Protein: 6.8 g/dL (ref 6.0–8.5)
eGFR: 124 mL/min/{1.73_m2} (ref >59)

## 2022-01-06 LAB — LIPID PANEL
Chol/HDL Ratio: 3.1 ratio (ref 0.0–5.0)
Cholesterol, Total: 156 mg/dL (ref 100–199)
HDL: 51 mg/dL (ref >39)
LDL Chol Calc (NIH): 88 mg/dL (ref 0–99)
Triglycerides: 92 mg/dL (ref 0–149)
VLDL Cholesterol Cal: 17 mg/dL (ref 5–40)

## 2022-01-06 LAB — VITAMIN D 25 HYDROXY (VIT D DEFICIENCY, FRACTURES): Vit D, 25-Hydroxy: 26.8 ng/mL — ABNORMAL LOW (ref 30.0–100.0)

## 2022-01-06 LAB — TSH: TSH: 1.76 u[IU]/mL (ref 0.450–4.500)

## 2022-01-06 LAB — T4, FREE: Free T4: 1.03 ng/dL (ref 0.82–1.77)

## 2022-03-05 ENCOUNTER — Encounter: Payer: Self-pay | Admitting: Physician Assistant

## 2022-03-06 ENCOUNTER — Other Ambulatory Visit: Payer: Self-pay

## 2022-03-06 DIAGNOSIS — E559 Vitamin D deficiency, unspecified: Secondary | ICD-10-CM

## 2022-03-06 MED ORDER — VITAMIN D (ERGOCALCIFEROL) 1.25 MG (50000 UNIT) PO CAPS: ORAL_CAPSULE | ORAL | 0 refills | Status: DC

## 2022-03-21 ENCOUNTER — Other Ambulatory Visit: Payer: Self-pay | Admitting: Physician Assistant

## 2022-03-21 DIAGNOSIS — E559 Vitamin D deficiency, unspecified: Secondary | ICD-10-CM

## 2022-06-15 ENCOUNTER — Telehealth: Payer: Self-pay

## 2022-06-15 NOTE — Telephone Encounter (Signed)
Pt is calling to request a VIT D refill. I advised him that after the Rx pts normally start and OTC supplement and that labs are normally checked to see how the supplement is treating the deficiency.  Please advise if Rx will be send or if OTC is recommended.  Pt comes back in for appt 08/10/22

## 2022-06-15 NOTE — Telephone Encounter (Signed)
Spoke with patient and informed him that he will need to start taking OTC Vitamin D 2000 units. All questions and concerns have been addressed.

## 2022-07-10 ENCOUNTER — Encounter: Payer: BC Managed Care – PPO | Admitting: Physician Assistant

## 2022-08-09 NOTE — Progress Notes (Unsigned)
Complete physical exam  Patient: Gregory Reed   DOB: 2001-05-14   21 y.o. Male  MRN: [id number]  Subjective:    No chief complaint on file.   Gregory Reed is a 22 y.o. male who presents today for a complete physical exam. He reports consuming a {diet types:17450} diet. {types:19826} He generally feels {DESC; WELL/FAIRLY WELL/POORLY:18703}. He reports sleeping {DESC; WELL/FAIRLY WELL/POORLY:18703}. He {does/does not:200015} have additional problems to discuss today.    Most recent fall risk assessment:    07/08/2021    9:18 AM  Fall Risk   Falls in the past year? 0  Number falls in past yr: 0  Injury with Fall? 0  Risk for fall due to : No Fall Risks  Follow up Falls evaluation completed     Most recent depression screenings:    01/05/2022   11:23 AM 07/08/2021    9:19 AM  PHQ 2/9 Scores  PHQ - 2 Score 2 2  PHQ- 9 Score 6 11    Patient Active Problem List   Diagnosis Date Noted   Vitamin D deficiency 02/13/2019   Poor motivation vs poor focus 02/25/2018   Subclinical hypothyroidism 12/26/2017   Elevated cholesterol with elevated triglycerides 12/26/2017   Low HDL (under 40) 12/26/2017   Anxious mood 10/15/2017   Insomnia 10/15/2017   Situational anxiety 08/24/2017   Left ankle injury 02/16/2017   Headache 01/29/2016   Seasonal allergic rhinitis 05/11/2014   Well adolescent visit 05/11/2014   Morbid obesity with BMI of 50.0-59.9, adult ([facility])     Past Surgical History:  Procedure Laterality Date   TONSILLECTOMY  2005   Social History   Tobacco Use   Smoking status: Never   Smokeless tobacco: Never  Vaping Use   Vaping Use: Never used  Substance Use Topics   Alcohol use: No    Alcohol/week: 0.0 standard drinks of alcohol   Drug use: No   Family History  Problem Relation Age of Onset   Aneurysm Paternal Grandfather        thoracic aortic   CVA Paternal Grandfather    CVA Paternal Uncle    Cancer Other        breast - maternal great grandmother    Arthritis Father    Crohn's disease Other        maternal side   Diabetes Neg Hx    CAD Neg Hx    Allergies  Allergen Reactions   Codeine Nausea And Vomiting     Patient Care Team: Lorrene Reid, PA-C (Inactive) as PCP - General Ria Bush, MD as Consulting Physician (Family Medicine)   Outpatient Medications Prior to Visit  Medication Sig   fluticasone (FLONASE) 50 MCG/ACT nasal spray Place 2 sprays into both nostrils daily.   levothyroxine (SYNTHROID) 25 MCG tablet TAKE 1 TABLET BY MOUTH EVERY DAY   Vitamin D, Ergocalciferol, (DRISDOL) 1.25 MG (50000 UNIT) CAPS capsule TAKE 1 CAPSULE BY MOUTH ONE TIME PER WEEK   No facility-administered medications prior to visit.    ROS     Objective:    There were no vitals taken for this visit.   Physical Exam   No results found for any visits on 08/10/22.     Assessment & Plan:    Routine Health Maintenance and Physical Exam  Immunization History  Administered Date(s) Administered   DTaP 03/15/1999, 05/25/1999, 07/22/1999, 04/23/2000   HIB (PRP-OMP) 03/15/1999, 05/25/1999, 07/22/1999, 01/20/2000   Hepatitis B 02/15/1999, 07/22/1999, 10/17/1999   IPV 03/15/1999, 05/25/1999,  04/23/2000   Influenza,inj,Quad PF,6+ Mos 05/11/2014, 02/15/2019, 04/06/2021   Influenza-Unspecified 02/15/2019   MMR 01/20/2000, 08/04/2003   Meningococcal Conjugate 05/11/2014, 05/11/2014, 01/28/2016   PFIZER(Purple Top)SARS-COV-2 Vaccination 09/04/2019, 09/29/2019, 04/10/2020   Pneumococcal Conjugate-13 05/25/1999, 07/22/1999, 10/20/1999, 01/20/2000   Tdap 05/11/2014   Varicella 04/23/2000, 06/26/2014    Health Maintenance  Topic Date Due   HPV VACCINES (1 - Male 2-dose series) Never done   HIV Screening  Never done   Hepatitis C Screening  Never done   INFLUENZA VACCINE  01/10/2022   COVID-19 Vaccine (4 - 2023-24 season) 02/10/2022   DTaP/Tdap/Td (6 - Td or Tdap) 05/11/2024    Discussed health benefits of physical activity, and  encouraged him to engage in regular exercise appropriate for his age and condition.  There are no diagnoses linked to this encounter.  No follow-ups on file.     Velva Harman, PA

## 2022-08-10 ENCOUNTER — Ambulatory Visit (INDEPENDENT_AMBULATORY_CARE_PROVIDER_SITE_OTHER): Payer: BC Managed Care – PPO | Admitting: Family Medicine

## 2022-08-10 ENCOUNTER — Encounter: Payer: Self-pay | Admitting: Family Medicine

## 2022-08-10 VITALS — BP 108/73 | HR 72 | Resp 20 | Ht 72.84 in | Wt 396.0 lb

## 2022-08-10 DIAGNOSIS — E782 Mixed hyperlipidemia: Secondary | ICD-10-CM

## 2022-08-10 DIAGNOSIS — Z0000 Encounter for general adult medical examination without abnormal findings: Secondary | ICD-10-CM | POA: Diagnosis not present

## 2022-08-10 DIAGNOSIS — E559 Vitamin D deficiency, unspecified: Secondary | ICD-10-CM | POA: Diagnosis not present

## 2022-08-10 DIAGNOSIS — Z6841 Body Mass Index (BMI) 40.0 and over, adult: Secondary | ICD-10-CM

## 2022-08-10 DIAGNOSIS — G4700 Insomnia, unspecified: Secondary | ICD-10-CM

## 2022-08-10 DIAGNOSIS — E038 Other specified hypothyroidism: Secondary | ICD-10-CM | POA: Diagnosis not present

## 2022-08-10 NOTE — Patient Instructions (Signed)
I included some information on the 3 sleep medications that we talked about.  You just let me know if at any point you want to schedule a visit to talk about starting one.  It was nice to meet you today! Reasons why it is important to allow yourself to process and experience true feelings: When you numb sadness, you also numb happiness and joy. Struggling with your emotions often leads to more suffering. Processing and experiencing your feelings is a part of having a full life.    Coping skills are things we can do to make ourselves feel better when we are going through difficult times. Examples of coping skills include:  Take a deep breath Count to 20 Listen to music Call a friend Take a walk Read a book Do a puzzle Meditate Journal Exercise Stretch [place] outside [place] a note Take a bath Watch a movie Pet an animal Visit a friend Be alone in a quiet place   When your anxiety gets worse, try these grounding techniques:      If you need additional help, please contact your primary care provider or call one of the resources listed below:  [place] at [phone] or [phone] [place], [place] [address]  [facility] 800-SUICIDE or [place] or [phone]  [facility] [email or web address]

## 2022-08-10 NOTE — Assessment & Plan Note (Signed)
Last TSH within normal limits at 1.76.  Continue current medication regimen levothyroxine 25 mcg daily.  Rechecking thyroid labs today.  Will make medication adjustments if indicated by pending lab results.

## 2022-08-10 NOTE — Assessment & Plan Note (Addendum)
Last vitamin D 26.8, patient on vitamin D 2000 units daily.  Will repeat vitamin D.  Will adjust medication as indicated by pending lab results.

## 2022-08-10 NOTE — Assessment & Plan Note (Signed)
Discussed possible medication options including Ambien, Remeron, trazodone for sleep.  He would like to read about each option more before making a decision.  Provided education during his appointment and printed information for him to review.  He will schedule follow-up to discuss options further if he does want to start 1.  Will continue to monitor.

## 2022-08-10 NOTE — Assessment & Plan Note (Addendum)
Last lipid panel within normal limits.  Will repeat lipid panel today.  Discussed following a heart healthy diet as best as possible, provided printout of information on budget friendly healthy eating.  Will continue to monitor.

## 2022-08-14 ENCOUNTER — Other Ambulatory Visit: Payer: BC Managed Care – PPO

## 2022-08-14 DIAGNOSIS — E559 Vitamin D deficiency, unspecified: Secondary | ICD-10-CM

## 2022-08-14 DIAGNOSIS — Z0000 Encounter for general adult medical examination without abnormal findings: Secondary | ICD-10-CM

## 2022-08-14 DIAGNOSIS — E782 Mixed hyperlipidemia: Secondary | ICD-10-CM

## 2022-08-14 DIAGNOSIS — E038 Other specified hypothyroidism: Secondary | ICD-10-CM

## 2022-08-14 DIAGNOSIS — Z6841 Body Mass Index (BMI) 40.0 and over, adult: Secondary | ICD-10-CM | POA: Diagnosis not present

## 2022-08-16 ENCOUNTER — Encounter: Payer: Self-pay | Admitting: Family Medicine

## 2022-08-16 LAB — VITAMIN D 25 HYDROXY (VIT D DEFICIENCY, FRACTURES): Vit D, 25-Hydroxy: 42.6 ng/mL (ref 30.0–100.0)

## 2022-08-16 LAB — CBC WITH DIFFERENTIAL/PLATELET
Basophils Absolute: 0.1 10*3/uL (ref 0.0–0.2)
Basos: 1 %
EOS (ABSOLUTE): 0.2 10*3/uL (ref 0.0–0.4)
Eos: 2 %
Hematocrit: 41.4 % (ref 37.5–51.0)
Hemoglobin: 13.9 g/dL (ref 13.0–17.7)
Immature Grans (Abs): 0 10*3/uL (ref 0.0–0.1)
Immature Granulocytes: 0 %
Lymphocytes Absolute: 3.5 10*3/uL — ABNORMAL HIGH (ref 0.7–3.1)
Lymphs: 35 %
MCH: 30.5 pg (ref 26.6–33.0)
MCHC: 33.6 g/dL (ref 31.5–35.7)
MCV: 91 fL (ref 79–97)
Monocytes Absolute: 0.8 10*3/uL (ref 0.1–0.9)
Monocytes: 8 %
Neutrophils Absolute: 5.3 10*3/uL (ref 1.4–7.0)
Neutrophils: 54 %
Platelets: 270 10*3/uL (ref 150–450)
RBC: 4.55 x10E6/uL (ref 4.14–5.80)
RDW: 12.4 % (ref 11.6–15.4)
WBC: 10 10*3/uL (ref 3.4–10.8)

## 2022-08-16 LAB — HEMOGLOBIN A1C
Est. average glucose Bld gHb Est-mCnc: 108 mg/dL
Hgb A1c MFr Bld: 5.4 % (ref 4.8–5.6)

## 2022-08-16 LAB — COMPREHENSIVE METABOLIC PANEL
ALT: 44 IU/L (ref 0–44)
AST: 27 IU/L (ref 0–40)
Albumin/Globulin Ratio: 1.7 (ref 1.2–2.2)
Albumin: 4.1 g/dL — ABNORMAL LOW (ref 4.3–5.2)
Alkaline Phosphatase: 114 IU/L (ref 44–121)
BUN/Creatinine Ratio: 9 (ref 9–20)
BUN: 8 mg/dL (ref 6–20)
Bilirubin Total: 0.3 mg/dL (ref 0.0–1.2)
CO2: 23 mmol/L (ref 20–29)
Calcium: 9 mg/dL (ref 8.7–10.2)
Chloride: 100 mmol/L (ref 96–106)
Creatinine, Ser: 0.9 mg/dL (ref 0.76–1.27)
Globulin, Total: 2.4 g/dL (ref 1.5–4.5)
Glucose: 87 mg/dL (ref 70–99)
Potassium: 4.1 mmol/L (ref 3.5–5.2)
Sodium: 139 mmol/L (ref 134–144)
Total Protein: 6.5 g/dL (ref 6.0–8.5)
eGFR: 123 mL/min/{1.73_m2} (ref >59)

## 2022-08-16 LAB — LIPID PANEL
Chol/HDL Ratio: 3.2 ratio (ref 0.0–5.0)
Cholesterol, Total: 142 mg/dL (ref 100–199)
HDL: 44 mg/dL (ref >39)
LDL Chol Calc (NIH): 83 mg/dL (ref 0–99)
Triglycerides: 78 mg/dL (ref 0–149)
VLDL Cholesterol Cal: 15 mg/dL (ref 5–40)

## 2022-08-16 LAB — T4, FREE: Free T4: 1.2 ng/dL (ref 0.82–1.77)

## 2022-08-16 LAB — TSH: TSH: 4.35 u[IU]/mL (ref 0.450–4.500)

## 2022-10-26 ENCOUNTER — Other Ambulatory Visit: Payer: Self-pay

## 2022-10-26 DIAGNOSIS — E038 Other specified hypothyroidism: Secondary | ICD-10-CM

## 2022-10-26 MED ORDER — LEVOTHYROXINE SODIUM 25 MCG PO TABS: 25.0000 ug | ORAL_TABLET | Freq: Every day | ORAL | 1 refills | Status: DC

## 2022-12-23 ENCOUNTER — Encounter: Payer: Self-pay | Admitting: Family Medicine

## 2022-12-23 DIAGNOSIS — J302 Other seasonal allergic rhinitis: Secondary | ICD-10-CM

## 2022-12-25 MED ORDER — FLUTICASONE PROPIONATE 50 MCG/ACT NA SUSP
2.0000 | Freq: Every day | NASAL | 11 refills | Status: AC
Start: 2022-12-25 — End: ?

## 2023-01-18 ENCOUNTER — Other Ambulatory Visit: Payer: Self-pay | Admitting: Family Medicine

## 2023-01-18 DIAGNOSIS — Z0000 Encounter for general adult medical examination without abnormal findings: Secondary | ICD-10-CM

## 2023-01-18 DIAGNOSIS — E038 Other specified hypothyroidism: Secondary | ICD-10-CM

## 2023-01-18 DIAGNOSIS — E782 Mixed hyperlipidemia: Secondary | ICD-10-CM

## 2023-02-01 ENCOUNTER — Other Ambulatory Visit: Payer: BC Managed Care – PPO

## 2023-02-01 DIAGNOSIS — Z0000 Encounter for general adult medical examination without abnormal findings: Secondary | ICD-10-CM

## 2023-02-01 DIAGNOSIS — E782 Mixed hyperlipidemia: Secondary | ICD-10-CM | POA: Diagnosis not present

## 2023-02-01 DIAGNOSIS — E038 Other specified hypothyroidism: Secondary | ICD-10-CM | POA: Diagnosis not present

## 2023-02-02 LAB — COMPREHENSIVE METABOLIC PANEL
ALT: 34 IU/L (ref 0–44)
AST: 24 IU/L (ref 0–40)
Albumin: 3.9 g/dL — ABNORMAL LOW (ref 4.3–5.2)
Alkaline Phosphatase: 108 IU/L (ref 44–121)
BUN/Creatinine Ratio: 10 (ref 9–20)
BUN: 10 mg/dL (ref 6–20)
Bilirubin Total: 0.3 mg/dL (ref 0.0–1.2)
CO2: 22 mmol/L (ref 20–29)
Calcium: 9.1 mg/dL (ref 8.7–10.2)
Chloride: 102 mmol/L (ref 96–106)
Creatinine, Ser: 1.03 mg/dL (ref 0.76–1.27)
Globulin, Total: 2.2 g/dL (ref 1.5–4.5)
Glucose: 85 mg/dL (ref 70–99)
Potassium: 4.5 mmol/L (ref 3.5–5.2)
Sodium: 142 mmol/L (ref 134–144)
Total Protein: 6.1 g/dL (ref 6.0–8.5)
eGFR: 104 mL/min/{1.73_m2} (ref >59)

## 2023-02-02 LAB — CBC
Hematocrit: 42.2 % (ref 37.5–51.0)
Hemoglobin: 13.8 g/dL (ref 13.0–17.7)
MCH: 29.5 pg (ref 26.6–33.0)
MCHC: 32.7 g/dL (ref 31.5–35.7)
MCV: 90 fL (ref 79–97)
Platelets: 279 10*3/uL (ref 150–450)
RBC: 4.68 x10E6/uL (ref 4.14–5.80)
RDW: 12.2 % (ref 11.6–15.4)
WBC: 9.5 10*3/uL (ref 3.4–10.8)

## 2023-02-02 LAB — TSH: TSH: 2.37 u[IU]/mL (ref 0.450–4.500)

## 2023-02-02 LAB — HEMOGLOBIN A1C
Est. average glucose Bld gHb Est-mCnc: 105 mg/dL
Hgb A1c MFr Bld: 5.3 % (ref 4.8–5.6)

## 2023-02-02 LAB — LIPID PANEL
Chol/HDL Ratio: 3.6 ratio (ref 0.0–5.0)
Cholesterol, Total: 146 mg/dL (ref 100–199)
HDL: 41 mg/dL (ref >39)
LDL Chol Calc (NIH): 84 mg/dL (ref 0–99)
Triglycerides: 115 mg/dL (ref 0–149)
VLDL Cholesterol Cal: 21 mg/dL (ref 5–40)

## 2023-02-08 ENCOUNTER — Ambulatory Visit: Payer: BC Managed Care – PPO | Admitting: Family Medicine

## 2023-02-13 ENCOUNTER — Encounter: Payer: Self-pay | Admitting: Family Medicine

## 2023-02-13 ENCOUNTER — Ambulatory Visit (INDEPENDENT_AMBULATORY_CARE_PROVIDER_SITE_OTHER): Payer: BC Managed Care – PPO | Admitting: Family Medicine

## 2023-02-13 VITALS — BP 120/80 | HR 65 | Ht 72.0 in | Wt 382.2 lb

## 2023-02-13 DIAGNOSIS — Z6841 Body Mass Index (BMI) 40.0 and over, adult: Secondary | ICD-10-CM

## 2023-02-13 DIAGNOSIS — Z0000 Encounter for general adult medical examination without abnormal findings: Secondary | ICD-10-CM

## 2023-02-13 DIAGNOSIS — Z1159 Encounter for screening for other viral diseases: Secondary | ICD-10-CM

## 2023-02-13 DIAGNOSIS — E782 Mixed hyperlipidemia: Secondary | ICD-10-CM

## 2023-02-13 DIAGNOSIS — E038 Other specified hypothyroidism: Secondary | ICD-10-CM | POA: Diagnosis not present

## 2023-02-13 DIAGNOSIS — L282 Other prurigo: Secondary | ICD-10-CM

## 2023-02-13 MED ORDER — LEVOTHYROXINE SODIUM 25 MCG PO TABS
25.0000 ug | ORAL_TABLET | Freq: Every day | ORAL | 1 refills | Status: DC
Start: 2023-02-13 — End: 2023-04-23

## 2023-02-13 NOTE — Assessment & Plan Note (Signed)
Last TSH within normal limits.  Continue current medication regimen levothyroxine 25 mcg daily.  Will continue to monitor.

## 2023-02-13 NOTE — Progress Notes (Signed)
   Established Patient Office Visit  Subjective   Patient ID: Gregory Reed, male    DOB: 2000/11/18  Age: 21 y.o. MRN: [id number]  Chief Complaint  Patient presents with   Medical Management of Chronic Issues    HPI Gregory Reed is a 22 y.o. male presenting today for follow up of hypothyroidism.  He also has concerns about bug bites that first appeared last week and gradually increased over the course of about 2 days.  They are itchy but not painful.  Over-the-counter hydrocortisone cream has alleviated the pruritus.  He denies any new soaps, detergents, lotions.  No one in the house and none of his contacts have similar spots.  Overall, the spots are improving.  Denies fever, chills, myalgias. Hypothyroidism: Taking levothyroxine 25 mcg regularly in the AM away from food and vitamins. Denies fatigue, weight changes, heat/cold intolerance, skin/hair changes, bowel changes, CVS symptoms.  ROS Negative unless otherwise noted in HPI   Objective:     BP 120/80   Pulse 65   Ht 6' (1.829 m)   Wt (!) 382 lb 3.8 oz (173.4 kg)   SpO2 96%   BMI 51.84 kg/m   Physical Exam Constitutional:      General: He is not in acute distress.    Appearance: Normal appearance.  HENT:     Head: Normocephalic and atraumatic.  Cardiovascular:     Rate and Rhythm: Normal rate and regular rhythm.     Heart sounds: Normal heart sounds. No murmur heard.    No friction rub. No gallop.  Pulmonary:     Effort: Pulmonary effort is normal. No respiratory distress.     Breath sounds: Normal breath sounds. No wheezing, rhonchi or rales.  Skin:    General: Skin is warm and dry.     Findings: Rash (Erythematous papules scattered across bilateral arms, legs, hands, feet.  No excoriations, not in a linear pattern) present.  Neurological:     Mental Status: He is alert and oriented to person, place, and time.  Psychiatric:        Mood and Affect: Mood normal.     Assessment & Plan:  Subclinical  hypothyroidism Assessment & Plan: Last TSH within normal limits.  Continue current medication regimen levothyroxine 25 mcg daily.  Will continue to monitor.   Screening for viral disease -     Hepatitis C antibody; Future -     HIV Antibody (routine testing w rflx); Future  Pruritic rash  Recommended to continue over-the-counter steroid cream for relief of pruritus.  Presentation does not appear to be scabies but may be another cause of bug bites.  I advised him to wash all sheets, blankets, close in hot water to eradicate any potential sources of bug bites.  He will reach out if condition has not resolved in the next 1-2 weeks.  Return in about 6 months (around 08/13/2023) for annual physical, fasting blood work 1 week before.    Melida Quitter, PA

## 2023-04-22 ENCOUNTER — Other Ambulatory Visit: Payer: Self-pay | Admitting: Family Medicine

## 2023-04-22 DIAGNOSIS — E038 Other specified hypothyroidism: Secondary | ICD-10-CM

## 2023-07-19 ENCOUNTER — Encounter: Payer: Self-pay | Admitting: Family Medicine

## 2023-08-06 ENCOUNTER — Other Ambulatory Visit: Payer: BC Managed Care – PPO

## 2023-08-06 DIAGNOSIS — E782 Mixed hyperlipidemia: Secondary | ICD-10-CM

## 2023-08-06 DIAGNOSIS — Z0000 Encounter for general adult medical examination without abnormal findings: Secondary | ICD-10-CM

## 2023-08-06 DIAGNOSIS — E038 Other specified hypothyroidism: Secondary | ICD-10-CM

## 2023-08-06 DIAGNOSIS — Z1159 Encounter for screening for other viral diseases: Secondary | ICD-10-CM

## 2023-08-10 DIAGNOSIS — Z6841 Body Mass Index (BMI) 40.0 and over, adult: Secondary | ICD-10-CM | POA: Diagnosis not present

## 2023-08-10 DIAGNOSIS — Z0000 Encounter for general adult medical examination without abnormal findings: Secondary | ICD-10-CM | POA: Diagnosis not present

## 2023-08-10 DIAGNOSIS — E038 Other specified hypothyroidism: Secondary | ICD-10-CM | POA: Diagnosis not present

## 2023-08-10 DIAGNOSIS — Z1159 Encounter for screening for other viral diseases: Secondary | ICD-10-CM | POA: Diagnosis not present

## 2023-08-10 DIAGNOSIS — E782 Mixed hyperlipidemia: Secondary | ICD-10-CM | POA: Diagnosis not present

## 2023-08-11 LAB — COMPREHENSIVE METABOLIC PANEL
ALT: 31 IU/L (ref 0–44)
AST: 23 IU/L (ref 0–40)
Albumin: 4.1 g/dL — ABNORMAL LOW (ref 4.3–5.2)
Alkaline Phosphatase: 110 IU/L (ref 44–121)
BUN/Creatinine Ratio: 11 (ref 9–20)
BUN: 11 mg/dL (ref 6–20)
Bilirubin Total: 0.4 mg/dL (ref 0.0–1.2)
CO2: 25 mmol/L (ref 20–29)
Calcium: 9.1 mg/dL (ref 8.7–10.2)
Chloride: 105 mmol/L (ref 96–106)
Creatinine, Ser: 0.99 mg/dL (ref 0.76–1.27)
Globulin, Total: 2.2 g/dL (ref 1.5–4.5)
Glucose: 90 mg/dL (ref 70–99)
Potassium: 4.5 mmol/L (ref 3.5–5.2)
Sodium: 141 mmol/L (ref 134–144)
Total Protein: 6.3 g/dL (ref 6.0–8.5)
eGFR: 109 mL/min/{1.73_m2} (ref >59)

## 2023-08-11 LAB — VITAMIN D 25 HYDROXY (VIT D DEFICIENCY, FRACTURES): Vit D, 25-Hydroxy: 34.8 ng/mL (ref 30.0–100.0)

## 2023-08-11 LAB — CBC WITH DIFFERENTIAL/PLATELET
Basophils Absolute: 0.1 10*3/uL (ref 0.0–0.2)
Basos: 1 %
EOS (ABSOLUTE): 0.1 10*3/uL (ref 0.0–0.4)
Eos: 1 %
Hematocrit: 42.5 % (ref 37.5–51.0)
Hemoglobin: 14.3 g/dL (ref 13.0–17.7)
Immature Grans (Abs): 0 10*3/uL (ref 0.0–0.1)
Immature Granulocytes: 1 %
Lymphocytes Absolute: 2.3 10*3/uL (ref 0.7–3.1)
Lymphs: 26 %
MCH: 30.2 pg (ref 26.6–33.0)
MCHC: 33.6 g/dL (ref 31.5–35.7)
MCV: 90 fL (ref 79–97)
Monocytes Absolute: 0.7 10*3/uL (ref 0.1–0.9)
Monocytes: 7 %
Neutrophils Absolute: 5.6 10*3/uL (ref 1.4–7.0)
Neutrophils: 64 %
Platelets: 278 10*3/uL (ref 150–450)
RBC: 4.74 x10E6/uL (ref 4.14–5.80)
RDW: 12 % (ref 11.6–15.4)
WBC: 8.8 10*3/uL (ref 3.4–10.8)

## 2023-08-11 LAB — HEMOGLOBIN A1C
Est. average glucose Bld gHb Est-mCnc: 105 mg/dL
Hgb A1c MFr Bld: 5.3 % (ref 4.8–5.6)

## 2023-08-11 LAB — LIPID PANEL
Chol/HDL Ratio: 3.6 ratio (ref 0.0–5.0)
Cholesterol, Total: 152 mg/dL (ref 100–199)
HDL: 42 mg/dL (ref >39)
LDL Chol Calc (NIH): 96 mg/dL (ref 0–99)
Triglycerides: 74 mg/dL (ref 0–149)
VLDL Cholesterol Cal: 14 mg/dL (ref 5–40)

## 2023-08-11 LAB — TSH RFX ON ABNORMAL TO FREE T4: TSH: 1.39 u[IU]/mL (ref 0.450–4.500)

## 2023-08-11 LAB — HEPATITIS C ANTIBODY: Hep C Virus Ab: NONREACTIVE

## 2023-08-11 LAB — HIV ANTIBODY (ROUTINE TESTING W REFLEX): HIV Screen 4th Generation wRfx: NONREACTIVE

## 2023-08-13 ENCOUNTER — Ambulatory Visit (INDEPENDENT_AMBULATORY_CARE_PROVIDER_SITE_OTHER): Payer: BC Managed Care – PPO | Admitting: Family Medicine

## 2023-08-13 ENCOUNTER — Encounter: Payer: Self-pay | Admitting: Family Medicine

## 2023-08-13 VITALS — BP 119/74 | HR 56 | Ht 72.0 in | Wt 379.2 lb

## 2023-08-13 DIAGNOSIS — Z0000 Encounter for general adult medical examination without abnormal findings: Secondary | ICD-10-CM | POA: Diagnosis not present

## 2023-08-13 NOTE — Progress Notes (Signed)
 Complete physical exam  Patient: Gregory Reed   DOB: 02/16/2001   22 y.o. Male  MRN: [id number]  Subjective:    Chief Complaint  Patient presents with   Annual Exam    Gregory Reed is a 23 y.o. male who presents today for a complete physical exam. He reports consuming a general diet.  He generally feels well. He reports difficulty falling asleep so he ends up staying up very late. He does not have additional problems to discuss today.    Most recent fall risk assessment:    08/13/2023   11:07 AM  Fall Risk   Falls in the past year? 0  Number falls in past yr: 0  Injury with Fall? 0  Risk for fall due to : No Fall Risks  Follow up Falls evaluation completed     Most recent depression and anxiety screenings:    08/13/2023   11:07 AM 02/13/2023   11:17 AM  PHQ 2/9 Scores  PHQ - 2 Score 1 2  PHQ- 9 Score 7 13      08/13/2023   11:07 AM 08/10/2022    9:07 AM 01/05/2022   11:24 AM 07/08/2021    9:19 AM  GAD 7 : Generalized Anxiety Score  Nervous, Anxious, on Edge 2 1 1 1   Control/stop worrying 2 1 1 1   Worry too much - different things 2 1 1 1   Trouble relaxing 1 2 1 1   Restless 1 3 0 1  Easily annoyed or irritable 2 1 1 1   Afraid - awful might happen 1 0 1 0  Total GAD 7 Score 11 9 6 6   Anxiety Difficulty Somewhat difficult Not difficult at all  Not difficult at all    Patient Active Problem List   Diagnosis Date Noted   Vitamin D deficiency 02/13/2019   Poor motivation vs poor focus 02/25/2018   Subclinical hypothyroidism 12/26/2017   Low HDL (under 40) 12/26/2017   Insomnia 10/15/2017   Situational anxiety 08/24/2017   Seasonal allergic rhinitis 05/11/2014   Morbid obesity with BMI of 50.0-59.9, adult ([facility])     Past Surgical History:  Procedure Laterality Date   TONSILLECTOMY  2005   Social History   Tobacco Use   Smoking status: Never    Passive exposure: Never   Smokeless tobacco: Never  Vaping Use   Vaping status: Never Used  Substance Use Topics    Alcohol use: No    Alcohol/week: 0.0 standard drinks of alcohol   Drug use: No   Family History  Problem Relation Age of Onset   Aneurysm Paternal Grandfather        thoracic aortic   CVA Paternal Grandfather    CVA Paternal Uncle    Cancer Other        breast - maternal great grandmother   Arthritis Father    Crohn's disease Other        maternal side   Diabetes Neg Hx    CAD Neg Hx    Allergies  Allergen Reactions   Codeine Nausea And Vomiting     Patient Care Team: Melida Quitter, PA as PCP - General (Family Medicine) Eustaquio Boyden, MD as Consulting Physician (Family Medicine)   Outpatient Medications Prior to Visit  Medication Sig   fluticasone (FLONASE) 50 MCG/ACT nasal spray Place 2 sprays into both nostrils daily.   levothyroxine (SYNTHROID) 25 MCG tablet TAKE 1 TABLET BY MOUTH EVERY DAY   No facility-administered medications prior to visit.  Review of Systems  Constitutional:  Negative for chills, fever and malaise/fatigue.  HENT:  Negative for congestion and hearing loss.   Eyes:  Negative for blurred vision and double vision.  Respiratory:  Negative for cough and shortness of breath.   Cardiovascular:  Negative for chest pain, palpitations and leg swelling.  Gastrointestinal:  Negative for abdominal pain, constipation, diarrhea and heartburn.  Genitourinary:  Negative for frequency and urgency.  Musculoskeletal:  Negative for myalgias and neck pain.  Neurological:  Negative for headaches.  Endo/Heme/Allergies:  Negative for polydipsia.  Psychiatric/Behavioral:  Negative for depression. The patient has insomnia. The patient is not nervous/anxious.       Objective:    BP 119/74   Pulse (!) 56   Ht 6' (1.829 m)   Wt (!) 379 lb 4 oz (172 kg)   SpO2 96%   BMI 51.44 kg/m    Physical Exam Constitutional:      General: He is not in acute distress.    Appearance: Normal appearance.  HENT:     Head: Normocephalic and atraumatic.     Right  Ear: Tympanic membrane, ear canal and external ear normal. There is no impacted cerumen.     Left Ear: Tympanic membrane, ear canal and external ear normal. There is no impacted cerumen.     Nose: Nose normal.     Mouth/Throat:     Mouth: Mucous membranes are moist.     Pharynx: Oropharynx is clear. No posterior oropharyngeal erythema.  Eyes:     Extraocular Movements: Extraocular movements intact.     Conjunctiva/sclera: Conjunctivae normal.     Pupils: Pupils are equal, round, and reactive to light.     Comments: Wearing glasses  Neck:     Thyroid: No thyroid mass, thyromegaly or thyroid tenderness.  Cardiovascular:     Rate and Rhythm: Normal rate and regular rhythm.     Heart sounds: Normal heart sounds. No murmur heard.    No friction rub. No gallop.  Pulmonary:     Effort: Pulmonary effort is normal. No respiratory distress.     Breath sounds: Normal breath sounds. No stridor. No wheezing or rales.  Abdominal:     General: Bowel sounds are normal.     Palpations: Abdomen is soft. There is no mass.     Tenderness: There is no abdominal tenderness.  Musculoskeletal:        General: Normal range of motion.     Cervical back: Normal range of motion and neck supple.  Lymphadenopathy:     Cervical: No cervical adenopathy.  Skin:    General: Skin is warm and dry.  Neurological:     Mental Status: He is alert and oriented to person, place, and time.     Cranial Nerves: No cranial nerve deficit.     Motor: No weakness.     Deep Tendon Reflexes: Reflexes normal.  Psychiatric:        Mood and Affect: Mood normal.        Assessment & Plan:    Routine Health Maintenance and Physical Exam  Immunization History  Administered Date(s) Administered   DTaP 03/15/1999, 05/25/1999, 07/22/1999, 04/23/2000   HIB (PRP-OMP) 03/15/1999, 05/25/1999, 07/22/1999, 01/20/2000   HIB, Unspecified 03/15/1999, 05/25/1999, 07/22/1999, 01/20/2000   Hep B, Unspecified 02/15/1999, 07/22/1999,  10/20/1999   Hepatitis B 02/15/1999, 07/22/1999, 10/17/1999   IPV 03/15/1999, 05/25/1999, 04/23/2000   Influenza,inj,Quad PF,6+ Mos 05/11/2014, 02/15/2019, 04/06/2021   Influenza-Unspecified 05/11/2014, 02/15/2019, 04/26/2019, 03/26/2023   MMR  01/20/2000, 08/04/2003   Meningococcal Conjugate 05/11/2014, 05/11/2014, 01/28/2016   PFIZER(Purple Top)SARS-COV-2 Vaccination 09/04/2019, 09/29/2019, 04/10/2020, 04/28/2023   Pfizer Covid-19 Vaccine Bivalent Booster 46yrs & up 09/27/2021   Pfizer(Comirnaty)Fall Seasonal Vaccine 12 years and older 04/28/2023   Pneumococcal Conjugate PCV 7 05/25/1999, 07/22/1999, 10/20/1999, 01/20/2000   Pneumococcal Conjugate-13 05/25/1999, 07/22/1999, 10/20/1999, 01/20/2000   Tdap 05/11/2014   Varicella 04/23/2000, 06/26/2014    Health Maintenance  Topic Date Due   HPV VACCINES (1 - Male 3-dose series) Never done   COVID-19 Vaccine (6 - 2024-25 season) 08/29/2023 (Originally 06/23/2023)   DTaP/Tdap/Td (6 - Td or Tdap) 05/11/2024   Pneumococcal Vaccine 31-75 Years old  Completed   INFLUENZA VACCINE  Completed   Hepatitis C Screening  Completed   HIV Screening  Completed    Reviewed most recent labs including CBC, CMP, lipid panel, A1C, TSH, and vitamin D. All within normal limits/stable from last check.  Discussed health benefits of physical activity, and encouraged him to engage in regular exercise appropriate for his age and condition.  Wellness examination    Return in about 1 year (around 08/12/2024) for annual physical, fasting labs 1 week before.     Melida Quitter, PA

## 2023-08-13 NOTE — Patient Instructions (Addendum)
 Your thyroid has been stable for going on 6 years now, so it is completely reasonable to plan on checking on your thyroid levels only at your annual physical moving forward!  PREVENTATIVE CARE: You are up-to-date on almost all of your preventative care.  The only other one for you to consider is the HPV vaccine series if you have not yet received it.  I have included some additional information for you to review at home.  If you have any questions or concerns, let us know.

## 2024-01-22 DIAGNOSIS — Z419 Encounter for procedure for purposes other than remedying health state, unspecified: Secondary | ICD-10-CM | POA: Diagnosis not present

## 2024-02-22 DIAGNOSIS — Z419 Encounter for procedure for purposes other than remedying health state, unspecified: Secondary | ICD-10-CM | POA: Diagnosis not present

## 2024-03-23 DIAGNOSIS — Z419 Encounter for procedure for purposes other than remedying health state, unspecified: Secondary | ICD-10-CM | POA: Diagnosis not present

## 2024-04-23 DIAGNOSIS — Z419 Encounter for procedure for purposes other than remedying health state, unspecified: Secondary | ICD-10-CM | POA: Diagnosis not present

## 2024-04-24 ENCOUNTER — Other Ambulatory Visit: Payer: Self-pay | Admitting: Family Medicine

## 2024-04-24 DIAGNOSIS — E038 Other specified hypothyroidism: Secondary | ICD-10-CM

## 2024-05-01 DIAGNOSIS — E038 Other specified hypothyroidism: Secondary | ICD-10-CM

## 2024-05-01 MED ORDER — LEVOTHYROXINE SODIUM 25 MCG PO TABS: 25.0000 ug | ORAL_TABLET | Freq: Every day | ORAL | 1 refills | Status: AC

## 2024-05-01 NOTE — Telephone Encounter (Signed)
 Refill sent.

## 2024-05-23 DIAGNOSIS — Z419 Encounter for procedure for purposes other than remedying health state, unspecified: Secondary | ICD-10-CM | POA: Diagnosis not present

## 2024-08-05 ENCOUNTER — Other Ambulatory Visit

## 2024-08-12 ENCOUNTER — Encounter
